# Patient Record
Sex: Male | Born: 1975
Health system: Southern US, Community
[De-identification: ages and names within clinical notes are randomized; demographics above are authoritative.]

## PROBLEM LIST (undated history)

## (undated) DIAGNOSIS — I10 Essential (primary) hypertension: Secondary | ICD-10-CM

## (undated) DIAGNOSIS — G5760 Lesion of plantar nerve, unspecified lower limb: Secondary | ICD-10-CM

## (undated) DIAGNOSIS — D860 Sarcoidosis of lung: Secondary | ICD-10-CM

## (undated) DIAGNOSIS — R002 Palpitations: Secondary | ICD-10-CM

## (undated) DIAGNOSIS — C801 Malignant (primary) neoplasm, unspecified: Secondary | ICD-10-CM

## (undated) DIAGNOSIS — R972 Elevated prostate specific antigen [PSA]: Secondary | ICD-10-CM

## (undated) DIAGNOSIS — G629 Polyneuropathy, unspecified: Secondary | ICD-10-CM

## (undated) DIAGNOSIS — M79673 Pain in unspecified foot: Secondary | ICD-10-CM

## (undated) HISTORY — DX: Elevated prostate specific antigen (PSA): R97.20

## (undated) HISTORY — DX: Pain in unspecified foot: M79.673

## (undated) HISTORY — DX: Polyneuropathy, unspecified: G62.9

## (undated) HISTORY — DX: Palpitations: R00.2

## (undated) HISTORY — DX: Sarcoidosis of lung: D86.0

## (undated) HISTORY — DX: Lesion of plantar nerve, unspecified lower limb: G57.60

## (undated) HISTORY — PX: LUNG BIOPSY: SHX232

---

## 2004-10-20 ENCOUNTER — Encounter: Admission: RE | Admit: 2004-10-20 | Discharge: 2004-10-20 | Payer: Self-pay | Admitting: Internal Medicine

## 2004-10-21 ENCOUNTER — Inpatient Hospital Stay (HOSPITAL_COMMUNITY): Admission: EM | Admit: 2004-10-21 | Discharge: 2004-10-22 | Payer: Self-pay | Admitting: Emergency Medicine

## 2006-05-24 ENCOUNTER — Encounter: Admission: RE | Admit: 2006-05-24 | Discharge: 2006-05-24 | Payer: Self-pay | Admitting: Internal Medicine

## 2006-06-01 ENCOUNTER — Ambulatory Visit: Payer: Self-pay | Admitting: Internal Medicine

## 2006-06-01 LAB — CONVERTED CEMR LAB: Sed Rate: 41 mm/hr — ABNORMAL HIGH (ref 0–20)

## 2006-06-03 ENCOUNTER — Encounter (INDEPENDENT_AMBULATORY_CARE_PROVIDER_SITE_OTHER): Payer: Self-pay | Admitting: Specialist

## 2006-06-03 ENCOUNTER — Ambulatory Visit: Payer: Self-pay | Admitting: Internal Medicine

## 2006-06-03 ENCOUNTER — Ambulatory Visit: Admission: RE | Admit: 2006-06-03 | Discharge: 2006-06-03 | Payer: Self-pay | Admitting: Internal Medicine

## 2006-09-09 ENCOUNTER — Emergency Department (HOSPITAL_COMMUNITY): Admission: EM | Admit: 2006-09-09 | Discharge: 2006-09-09 | Payer: Self-pay | Admitting: Emergency Medicine

## 2006-10-08 ENCOUNTER — Ambulatory Visit: Payer: Self-pay | Admitting: Internal Medicine

## 2007-01-23 ENCOUNTER — Emergency Department (HOSPITAL_COMMUNITY): Admission: EM | Admit: 2007-01-23 | Discharge: 2007-01-23 | Payer: Self-pay | Admitting: Emergency Medicine

## 2010-06-27 NOTE — H&P (Signed)
NAME:  [place], [place] Murphy NO.:  [id number]   MEDICAL RECORD NO.:  [id number]          PATIENT TYPE:  EMS   LOCATION:  MAJO                         FACILITY:  [facility]   PHYSICIAN:  Michaelyn Barter, M.D. DATE OF BIRTH:  12/30/1975   DATE OF ADMISSION:  10/20/2004  DATE OF DISCHARGE:                                HISTORY & PHYSICAL   PRIMARY CARE PHYSICIAN:  Georgianne Fick, M.D.   CHIEF COMPLAINT:  Headache.   HISTORY OF PRESENT ILLNESS:  Mr. Craig [place] is a 35 year old gentleman who  states that Thursday or Friday of last week he developed a headache. His  back started hurting later in the day. He had a fever of approximately 101  on Saturday and soreness in his eyes since Friday. He describes his headache  as pounding. It is made worse by standing up too fast. Almost similar to a  migraine. No nausea or emesis. Positive loss of appetite. No sick contact or  other individuals at home with similar symptoms. Some sensitivity to light.  The patient took Advil and DayQuil but neither stopped his pain.   PAST MEDICAL HISTORY:  No illnesses.   PAST SURGICAL HISTORY:  No illnesses.   ALLERGIES:  None.   MEDICATIONS:  Multivitamins.   SOCIAL HISTORY:  Cigarettes:  Denies. Alcohol:  One to two beers a week.   FAMILY HISTORY:  Mother has a history of diabetes and hypertension. Father  is deceased secondary to MI at the age of 20.   REVIEW OF SYSTEMS:  As per HIP, otherwise all other systems are negative.   PHYSICAL EXAMINATION:  GENERAL:  The patient appears to be sleepy; however,  he is cooperative.  VITAL SIGNS:  Blood pressure 140/80, temperature of 98.8.  HEENT:  Normocephalic and atraumatic. Extraocular movements are intact.  Tympanic membranes are also intact and they are pearly gray in color;  however, they does appear to be a slight amount of erythema around the  tympanic membranes. Neck is supple. No nuchal rigidity. No lymphadenopathy.  Thyroid is not  palpable.  CARDIOVASCULAR:  S1 and S2 is present. Regular rate and rhythm. No S3, S4.  No murmurs, gallops, or rubs.  LUNGS:  Clear. No crackles and no wheezes.  ABDOMEN:  Soft, nontender, and nondistended. Positive bowel sounds. Some  slight right quadrant tenderness to palpation.  EXTREMITIES:  No edema.  NEUROLOGICAL:  The patient is alert and oriented x3. Cranial nerves II  through XII intact. No facial asymmetry. No pronator drift. No focal  deficits.   LABORATORY DATA:  White blood cell count 4.6, hemoglobin 13.8, hematocrit  40.4, platelets 264,000. SGOT 24, SGPT 21. Sodium 135, potassium 3.5,  chloride 102, CO2 27, BUN 8, creatinine 1.3, glucose 103. Total protein 6.7,  albumin 3.8, calcium 8.9, alkaline phosphate 58. CSF from tube #1 white  blood cells were 40, red blood cells 325, segmented neutrophils 12,  lymphocytes 66, monocytes/macrophages 22, CSF glucose 57, total protein 51.  From tube #4, white blood cell count 92, red blood cell count 46, segmented  neutrophils 8, lymphocytes 69, monocytes macrophages 23.   ASSESSMENT/PLAN:  Headache:  The etiology of which per lumbar puncture  results indicates that the patient may have aseptic/viral meningitis as the  cause. Cultures have been sent but are still pending. We will follow up the  cultures. Neurology has also been consulted. Their final recommendations are  still pending. We will provide p.r.n. medications for now and we will  consider starting acyclovir until final lab results are provided.      Michaelyn Barter, M.D.  Electronically Signed     OR/MEDQ  D:  10/21/2004  T:  10/21/2004  Job:  [id number]   cc:   Georgianne Fick, M.D.  [address]  [place]  [place] [address]  Fax: [phone]

## 2010-06-27 NOTE — Assessment & Plan Note (Signed)
[facility]                             PULMONARY OFFICE NOTE   NAME:[place], [place] Murphy                    MRN:          [id number]  DATE:06/01/2006                            DOB:          1975-09-29    REFERRING PHYSICIAN:  Georgianne Fick, M.D.   HISTORY:  This is a very nice, 35 year old, black male who has been very  athletically active despite being a smoker until he abruptly stopped on  April 10, 2006 because it was time. However, shortly after he stopped  smoking, he began having generalized anterior chest discomfort worse  when he lies on his right side associated with a hacking cough  productive of minimum sputum and dyspnea that occurs paroxysmally and  without reproduction with activity. He has undergone a thorough  evaluation by Dr. Nicholos Johns that indicates bilateral hilar adenopathy  and interstitial lung disease and is therefore seen at Dr.  Carolyn Stare request for possible sarcoid.   The patient denies any ocular articular symptoms. He has noticed mild  sweats in the last several weeks but no unintended weight loss or  dysphagia.   PAST MEDICAL HISTORY:  This patient has basically been healthy and his  last x-ray was over 10 years ago.   MEDICATIONS:  None.   SOCIAL HISTORY:  He quit smoking in March 2008. As noted, he works as an  Personnel officer. He likes to work out all the time.   FAMILY HISTORY:  Positive for heart disease in his father. Negative for  rheumatologic disease including sarcoid.   REVIEW OF SYSTEMS:  Taken in detail on the worksheet and negative except  as outlined above.   PHYSICAL EXAMINATION:  GENERAL:  This is an anxious but quite pleasant,  ambulatory, healthy-appearing, black male in no acute distress.  VITAL SIGNS:  Temperature 100.4 in the office. Vital signs normal.  HEENT:  Oropharynx clear. Nasal turbinates reveal minimal edema. Ear  canals are clear bilaterally.  NECK:  Supple without  cervical adenopathy or tenderness. The trachea is  midline, no thyromegaly.  LUNGS:  Lung fields perfectly clear bilaterally to auscultation and  percussion.  HEART:  He has a regular rate and rhythm without murmur, gallop or rub.  ABDOMEN:  Soft and benign.  EXTREMITIES:  Warm without calf tenderness, cyanosis, clubbing or edema.   A thorough workup by Dr. Nicholos Johns was reviewed including a CT scan  of the chest from April 14 indicating mediastinal bilateral hilar  adenopathy with also right lower lobe nodules and air space disease and  an echocardiogram that revealed  trivial tricuspid regurgitation dated  May 24, 2006. CBC was normal and chemistry profile was normal with a  normal protein to albumin ratio and calcium levels.   IMPRESSION:  Classic sarcoid of unclear chronicity. I believe the  connection to stopping smoking is more related to the fact that  cigarettes make people feel like better then they really are rather than  a cause and effect related to stopping smoking. The differential  diagnosis unfortunately includes lymphoma and other granulomatous  processes but I think it is very unlikely based on how  healthy this  patient appears relative to his x-ray findings.   The chest discomfort is a bit unusual for sarcoid but probably is  related to bulky adenopathy which is worse on the right than the left  and note that the infiltrates are worse on the right than the left.   Therefore transbronchial biopsy of the right lower lobe is indicated to  pin down the diagnosis of sarcoid and then a short course of prednisone  (perhaps up to 6 weeks) could be considered.   The earliest that he can schedule this is for May 2 which is fine as  long as his condition does not deteriorate. I spent extra time talking  to him and his wife (nurse at [facility]) about the natural  history of sarcoid including the suspected etiology and my philosophy of  trying to find the  lowest dose of prednisone that suppresses the  symptoms and then tapering down from there.   In this context, an ACE level was pending today to see if we could get  more objective data to help guide therapy once it is initiated.     [place]. Craig Sires, MD, [facility]  Electronically Signed    MBW/MedQ  DD: 06/01/2006  DT: 06/01/2006  Job #: [id number]   cc:   Georgianne Fick, M.D.

## 2010-06-27 NOTE — Op Note (Signed)
NAME:  Craig Murphy, Craig Murphy           ACCOUNT NO.:  [id number]   MEDICAL RECORD NO.:  [id number]          PATIENT TYPE:  AMB   LOCATION:  CARD                         FACILITY:  [facility]   PHYSICIAN:  Casimiro Needle B. Sherene Sires, MD, FCCPDATE OF BIRTH:  01/[birth year]/76   DATE OF PROCEDURE:  06/03/2006  DATE OF DISCHARGE:                               OPERATIVE REPORT   PROCEDURE:  Fiberoptic bronchoscopy with bronchial alveolar lavage of  the right middle lobe and transbronchial biopsy of the right lower lobe.   HISTORY AND INDICATIONS:  Please see dictated consultation note.   The patient agreed to the procedure after a full discussion of risks,  benefits, and alternatives in the office.  He was premedicated with a  total of 100 mg of IV Demerol and 10 mg of Versed for adequate sedation  and cough suppression along with 1% lidocaine by updraft nebulizer.   He was continued to be monitored by surface ECG oximetry during the  procedure and nasal oxygen with only briefly desaturations in the upper  80s during the procedure.   Using a standard flexible fiberoptic bronchoscope, the right naris was  easily cannulated with good visualization of the entire oropharynx and  larynx.  The cords moved normally and there were no apparent upper  airway lesions.   Using additional 1% lidocaine the entire tracheobronchial tree was  explored bilaterally with the following findings.  Trachea; all the  airways opened widely.  There was minimal cobblestoning involving the  right-sided airways more than the left-sided airways.   1. The right middle lobe was lavaged in a standard fashion and samples      were sent for cytology, AFB and fungal stain and culture.  2. The transbronchial biopsies were performed in the wedge position in      the right lower lobe basal segments with adequate tissue obtained.      No obvious endobronchial bleeding was noted and no obvious alveolar      hemorrhage nor pneumothorax by  fluoroscopy.   The patient tolerated the procedure well and a follow-up chest x-ray is  pending.   IMPRESSION:  Sarcoidosis with minimal endobronchial involvement.  Since  this patient has had a low grade fever and worsening progressive dyspnea  he needs to be treated today with prednisone starting at 20 mg per day  and return to the office in 2 weeks for follow-up.  Ace level was also  pending at the time of this dictation.      Charlaine Dalton. Sherene Sires, MD, [facility]  Electronically Signed     MBW/MEDQ  D:  06/03/2006  T:  06/03/2006  Job:  [id number]   cc:   Georgianne Fick, M.D.  Fax: [phone]

## 2010-06-27 NOTE — Consult Note (Signed)
NAMEARLON, Murphy NO.:  [id number]   MEDICAL RECORD NO.:  [id number]          PATIENT TYPE:  EMS   LOCATION:  MAJO                         FACILITY:  [facility]   PHYSICIAN:  Genene Churn. Love, M.D.    DATE OF BIRTH:  03-Mar-1975   DATE OF CONSULTATION:  10/20/2004  DATE OF DISCHARGE:                                   CONSULTATION   This is 35 year old right-handed, black, married male was seen at the  request of Dr. Georgianne Fick and the ER physician for evaluation of a 3-  day history of progressive headaches.   HISTORY OF PRESENT ILLNESS:  [place] has a 3-day history of  progressive headaches, including the vertex and retro-orbital region  associated with upper back and lower back pain and a low-grade fever to 101  degrees for which he has been using Advil.  He reports that his daughter was  sick with a cold about 2 weeks ago.  The patient has had no history of  double vision, loss of vision, swallowing problems, slurred speech, black  out spells or seizures.  He does have a past history of palpitations.  He  denies any current problems with tick exposure or head or neck trauma and  there is no family history of aneurysm.   He was seen by Dr. Nicholos Johns today and had a CT scan of the brain which  was unremarkable.  Other laboratory data included a white blood cell count  of 5400, hemoglobin 14.9, hematocrit 44.3, and platelet count 270,000.  Sodium 137, potassium 4.3, chloride 105, CO2 content 27.  Liver function  tests were normal.   PHYSICAL EXAMINATION:  GENERAL:  Well-developed male in no acute distress.  VITAL SIGNS:  Temperature 98.8 degrees.  Blood pressure in the right arm was  140/80 and left arm 138/80.  Heart rate was 64 and regular.  NECK:  Stiff, he had a positive Kernig's, positive Brudzinski's.  There were  no bruits.  MENTAL STATUS:  He was alert and oriented x3.  NEUROLOGIC:  His cranial nerve examination revealed visual fields were  full,  the disks were flat.  Extraocular movements were full.  Corneals present.  No VII nerve palsy.  Hearing intact.  Air conduction greater than bone  conduction .  Tongue midline, uvula midline, gag present.  Sternocleidomastoid, trapezius testing normal.  Motor examination with 5/5  strength in upper and lower extremities.  Coordination testing with finger-  to-nose, heel-to-shin and rapid alternating movements normal.  Sensory  examination intact to pinprick, touch, position, vibration testing.  Deep  tendon reflexes 2+ and plantar response was downgoing.   IMPRESSION:  Aseptic meningitis by history and examination, 047.9.   PLAN:  At this time, obtain LP under fluoroscopy.           ______________________________  Genene Churn. Sandria Manly, M.D.     JML/MEDQ  D:  10/20/2004  T:  10/21/2004  Job:  [id number]

## 2010-11-24 LAB — CBC
HCT: 40.1
Hemoglobin: 14
MCHC: 34.9
MCV: 86.8

## 2010-11-24 LAB — COMPREHENSIVE METABOLIC PANEL
AST: 34
Alkaline Phosphatase: 62
CO2: 30
Calcium: 9.3
Chloride: 102
Creatinine, Ser: 1.19
GFR calc Af Amer: >60
GFR calc non Af Amer: >60
Sodium: 138
Total Protein: 7.2

## 2010-11-24 LAB — POCT CARDIAC MARKERS: CKMB, poc: 1.2

## 2010-11-24 LAB — DIFFERENTIAL
Basophils Absolute: 0
Eosinophils Absolute: 0.1
Lymphocytes Relative: 32
Lymphs Abs: 1.2
Monocytes Absolute: 0.4
Monocytes Relative: 11
Neutrophils Relative %: 54

## 2016-07-01 ENCOUNTER — Ambulatory Visit (INDEPENDENT_AMBULATORY_CARE_PROVIDER_SITE_OTHER): Payer: BLUE CROSS/BLUE SHIELD | Admitting: Pulmonary Disease

## 2016-07-01 ENCOUNTER — Ambulatory Visit (INDEPENDENT_AMBULATORY_CARE_PROVIDER_SITE_OTHER)
Admission: RE | Admit: 2016-07-01 | Discharge: 2016-07-01 | Disposition: A | Discharge status: Home / Self Care | Payer: BLUE CROSS/BLUE SHIELD | Source: Ambulatory Visit | Attending: Pulmonary Disease | Admitting: Pulmonary Disease

## 2016-07-01 ENCOUNTER — Encounter: Payer: Self-pay | Admitting: Pulmonary Disease

## 2016-07-01 VITALS — BP 122/82 | HR 85 | Ht 71.0 in | Wt 237.8 lb

## 2016-07-01 DIAGNOSIS — D869 Sarcoidosis, unspecified: Secondary | ICD-10-CM

## 2016-07-01 MED ORDER — PREDNISONE 10 MG PO TABS: ORAL_TABLET | ORAL | 0 refills | Status: DC

## 2016-07-01 NOTE — Assessment & Plan Note (Addendum)
Patient was diagnosed with sarcoidosis in 2008. He presented with dyspnea and cough. He had a bronchoscopy and biopsy done by Dr. Melvyn Novas. He improved with couple months of prednisone. Cough recurred recently.  Plan : 1. We extensively discussed the diagnosis of sarcoidosis. 2. Chest x-ray today. If abnormal, may need a chest CT scan. 3. Prednisone taper. We will start with 30 mg a day for a couple weeks. Try to cut down to 20 mg a day subsequently. Hopefully we can wean off over the next 2-3 months. 4. Discussed with him to have yearly eye checks. 5. Discussed with him regarding vaccinations. He wanted to hold off for now. 6. He will need PFT on follow-up.
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
## 2016-07-01 NOTE — Patient Instructions (Signed)
It was a pleasure taking care of you today!  You are diagnosed with Sarcoidosis  We will start you on prednisone, 10 mg per tablet, 3 tablets a day as we discussed. We will get a chest Xray today.   Please call the office if you are having issues with your medications   Return to clinic in 3 months with NP

## 2016-07-07 ENCOUNTER — Telehealth: Payer: Self-pay | Admitting: Pulmonary Disease

## 2016-07-07 MED ORDER — PREDNISONE 10 MG PO TABS: ORAL_TABLET | ORAL | 0 refills | Status: DC

## 2016-07-07 NOTE — Telephone Encounter (Signed)
Patient checking on rx for prednisone - he can be reached at [phone] -pr

## 2016-07-07 NOTE — Telephone Encounter (Signed)
   We were supposed to have sent rx for prednisone.  Anyway, pls send rx for prednisone : 10 mg/tab, 3 tabs/day for 1 month then cut down to 2 tabs a day for 1 month. Pt needs a f/u in 6-8 weeks. Thanks.   Monica Becton, MD 07/07/2016, 5:13 PM [facility] Pager [phone] After 3 pm or if no answer, call [phone]

## 2016-07-07 NOTE — Telephone Encounter (Signed)
lmtcb X1 for pt to make aware of prednisone recs.

## 2016-07-07 NOTE — Telephone Encounter (Signed)
Spoke with pt, requesting refill on prednisone.  Per 5/23 AVS:  Plan : 1. We extensively discussed the diagnosis of sarcoidosis. 2. Chest x-ray today. If abnormal, may need a chest CT scan. 3. Prednisone taper. We will start with 30 mg a day for a couple weeks. Try to cut down to 20 mg a day subsequently. Hopefully we can wean off over the next 2-3 months. 4. Discussed with him to have yearly eye checks. 5. Discussed with him regarding vaccinations. He wanted to hold off for now. 6. He will need PFT on follow-up.   Only 9 tabs of prednisone was called in to pharmacy on this visit.Durene Cal in a rx of prednisone for pt to continue taking 30mg  qd (sent in 90 tabs with 0 refills).  AD please advise on details for long term prednisone weaning so a rx can be sent in.  Thanks.

## 2016-07-08 NOTE — Telephone Encounter (Signed)
Spoke with the pt and notified of prednisone recs  He verbalized understanding  I scheduled f/u with SG for 09/07/16

## 2016-09-07 ENCOUNTER — Ambulatory Visit (INDEPENDENT_AMBULATORY_CARE_PROVIDER_SITE_OTHER): Payer: BLUE CROSS/BLUE SHIELD | Admitting: Acute Care

## 2016-09-07 ENCOUNTER — Encounter: Payer: Self-pay | Admitting: Acute Care

## 2016-09-07 DIAGNOSIS — D869 Sarcoidosis, unspecified: Secondary | ICD-10-CM

## 2016-09-07 NOTE — Assessment & Plan Note (Signed)
Recent flare is resolved. No cough or dyspnea Plan Follow up as needed for sarcoid flare, cough or dyspnea. Make sure you have your eye exam every year. Flu shot in the fall. Please contact office for sooner follow up if symptoms do not improve or worsen or seek emergency care  Patient states he does not want PFT at present time. Patient states he would prefer to follow-up on an as-needed basis.

## 2016-09-07 NOTE — Progress Notes (Signed)
Chart and office note reviewed in detail  > agree with a/p as outlined    
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
## 2016-09-07 NOTE — Patient Instructions (Signed)
I am glad you are doing better.  Follow up as needed for sarcoid flare, cough or dyspnea. Make sure you have your eye exam every year. Flu shot in the fall. Please contact office for sooner follow up if symptoms do not improve or worsen or seek emergency care

## 2016-10-01 ENCOUNTER — Ambulatory Visit: Payer: BLUE CROSS/BLUE SHIELD | Admitting: Acute Care

## 2018-04-29 DIAGNOSIS — Z0000 Encounter for general adult medical examination without abnormal findings: Secondary | ICD-10-CM | POA: Diagnosis not present

## 2018-04-29 DIAGNOSIS — M5412 Radiculopathy, cervical region: Secondary | ICD-10-CM | POA: Diagnosis not present

## 2018-04-29 DIAGNOSIS — R5382 Chronic fatigue, unspecified: Secondary | ICD-10-CM | POA: Diagnosis not present

## 2018-04-29 DIAGNOSIS — R600 Localized edema: Secondary | ICD-10-CM | POA: Diagnosis not present

## 2018-05-13 DIAGNOSIS — Z0000 Encounter for general adult medical examination without abnormal findings: Secondary | ICD-10-CM | POA: Diagnosis not present

## 2018-05-13 DIAGNOSIS — M5412 Radiculopathy, cervical region: Secondary | ICD-10-CM | POA: Diagnosis not present

## 2018-05-13 DIAGNOSIS — R600 Localized edema: Secondary | ICD-10-CM | POA: Diagnosis not present

## 2018-05-13 DIAGNOSIS — D860 Sarcoidosis of lung: Secondary | ICD-10-CM | POA: Diagnosis not present

## 2018-10-31 ENCOUNTER — Other Ambulatory Visit (INDEPENDENT_AMBULATORY_CARE_PROVIDER_SITE_OTHER): Payer: BC Managed Care – PPO

## 2018-10-31 ENCOUNTER — Other Ambulatory Visit: Payer: Self-pay | Admitting: Pulmonary Disease

## 2018-10-31 ENCOUNTER — Ambulatory Visit (INDEPENDENT_AMBULATORY_CARE_PROVIDER_SITE_OTHER)
Admission: RE | Admit: 2018-10-31 | Discharge: 2018-10-31 | Disposition: A | Discharge status: Home / Self Care | Payer: BC Managed Care – PPO | Source: Ambulatory Visit | Attending: Pulmonary Disease | Admitting: Pulmonary Disease

## 2018-10-31 ENCOUNTER — Encounter: Payer: Self-pay | Admitting: Pulmonary Disease

## 2018-10-31 ENCOUNTER — Telehealth: Payer: Self-pay

## 2018-10-31 ENCOUNTER — Ambulatory Visit (INDEPENDENT_AMBULATORY_CARE_PROVIDER_SITE_OTHER): Payer: BC Managed Care – PPO | Admitting: Pulmonary Disease

## 2018-10-31 ENCOUNTER — Other Ambulatory Visit: Payer: Self-pay

## 2018-10-31 DIAGNOSIS — R059 Cough, unspecified: Secondary | ICD-10-CM

## 2018-10-31 DIAGNOSIS — R05 Cough: Secondary | ICD-10-CM | POA: Diagnosis not present

## 2018-10-31 DIAGNOSIS — D869 Sarcoidosis, unspecified: Secondary | ICD-10-CM | POA: Diagnosis not present

## 2018-10-31 DIAGNOSIS — I517 Cardiomegaly: Secondary | ICD-10-CM | POA: Diagnosis not present

## 2018-10-31 LAB — CBC WITH DIFFERENTIAL/PLATELET
Basophils Absolute: 0 10*3/uL (ref 0.0–0.1)
Basophils Relative: 0.8 % (ref 0.0–3.0)
Eosinophils Absolute: 0.1 10*3/uL (ref 0.0–0.7)
Eosinophils Relative: 1.2 % (ref 0.0–5.0)
HCT: 42.4 % (ref 39.0–52.0)
Hemoglobin: 14.2 g/dL (ref 13.0–17.0)
Lymphocytes Relative: 32.2 % (ref 12.0–46.0)
Lymphs Abs: 1.6 10*3/uL (ref 0.7–4.0)
MCHC: 33.6 g/dL (ref 30.0–36.0)
MCV: 92.8 fl (ref 78.0–100.0)
Monocytes Absolute: 0.4 10*3/uL (ref 0.1–1.0)
Monocytes Relative: 8.5 % (ref 3.0–12.0)
Neutro Abs: 2.8 10*3/uL (ref 1.4–7.7)
Neutrophils Relative %: 57.3 % (ref 43.0–77.0)
Platelets: 273 10*3/uL (ref 150.0–400.0)
RBC: 4.57 Mil/uL (ref 4.22–5.81)
RDW: 13.3 % (ref 11.5–15.5)
WBC: 4.9 10*3/uL (ref 4.0–10.5)

## 2018-10-31 LAB — COMPREHENSIVE METABOLIC PANEL
ALT: 37 U/L (ref 0–53)
AST: 30 U/L (ref 0–37)
Albumin: 4.6 g/dL (ref 3.5–5.2)
Alkaline Phosphatase: 59 U/L (ref 39–117)
BUN: 16 mg/dL (ref 6–23)
CO2: 29 mEq/L (ref 19–32)
Calcium: 10 mg/dL (ref 8.4–10.5)
Chloride: 102 mEq/L (ref 96–112)
Creatinine, Ser: 1.17 mg/dL (ref 0.40–1.50)
GFR: 82.06 mL/min (ref >60.00)
Glucose, Bld: 95 mg/dL (ref 70–99)
Potassium: 4.2 mEq/L (ref 3.5–5.1)
Sodium: 139 mEq/L (ref 135–145)
Total Bilirubin: 0.5 mg/dL (ref 0.2–1.2)
Total Protein: 7.8 g/dL (ref 6.0–8.3)

## 2018-10-31 MED ORDER — BENZONATATE 200 MG PO CAPS: 200.0000 mg | ORAL_CAPSULE | Freq: Three times a day (TID) | ORAL | 1 refills | Status: DC | PRN
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
## 2018-10-31 NOTE — Patient Instructions (Signed)
You were seen today by Lauraine Rinne, NP  for:   1. Sarcoidosis  - DG Chest 2 View; Future - Angiotensin converting enzyme; Future - Comp Met (CMET); Future - CBC with Differential/Platelet; Future - Pulmonary function test; Future  We will get baseline lab work on you today We will also get a chest x-ray  Likely based off the symptoms that you are experiencing today we will start you on a course of prednisone.  We need to get you back into our office to establish with Dr. Vaughan Browner as well as to have a full pulmonary function test.  2. Cough  - DG Chest 2 View; Future  Suspect this is likely a sarcoid flare that will require steroids.  Will get chest x-ray to further evaluate prior to starting treatment.  We recommend today:  Orders Placed This Encounter  Procedures  . DG Chest 2 View    Standing Status:   Future    Standing Expiration Date:   12/31/2019    Order Specific Question:   Reason for Exam (SYMPTOM  OR DIAGNOSIS REQUIRED)    Answer:   hx sarcoid    Order Specific Question:   Preferred imaging location?    Answer:   [facility]    Order Specific Question:   Radiology Contrast Protocol - do NOT remove file path    Answer:   [email or web address]  . Angiotensin converting enzyme    Standing Status:   Future    Standing Expiration Date:   10/31/2019  . Comp Met (CMET)    Standing Status:   Future    Standing Expiration Date:   10/31/2019  . CBC with Differential/Platelet    Standing Status:   Future    Standing Expiration Date:   10/31/2019  . Pulmonary function test    Standing Status:   Future    Standing Expiration Date:   10/31/2019    Order Specific Question:   Where should this test be performed?    Answer:   [facility]    Order Specific Question:   Full PFT: includes the following: basic spirometry, spirometry pre & post bronchodilator, diffusion capacity (DLCO), lung volumes    Answer:   Full PFT   Orders Placed  This Encounter  Procedures  . DG Chest 2 View  . Angiotensin converting enzyme  . Comp Met (CMET)  . CBC with Differential/Platelet  . Pulmonary function test   No orders of the defined types were placed in this encounter.   Follow Up:    No follow-ups on file.   Please do your part to reduce the spread of COVID-19:      Reduce your risk of any infection  and COVID19 by using the similar precautions used for avoiding the common cold or flu:  Marland Kitchen Wash your hands often with soap and warm water for at least 20 seconds.  If soap and water are not readily available, use an alcohol-based hand sanitizer with at least 60% alcohol.  . If coughing or sneezing, cover your mouth and nose by coughing or sneezing into the elbow areas of your shirt or coat, into a tissue or into your sleeve (not your hands). Langley Gauss A MASK when in public  . Avoid shaking hands with others and consider head nods or verbal greetings only. . Avoid touching your eyes, nose, or mouth with unwashed hands.  . Avoid close contact with people who are sick. . Avoid places or events with large numbers  of people in one location, like concerts or sporting events. . If you have some symptoms but not all symptoms, continue to monitor at home and seek medical attention if your symptoms worsen. . If you are having a medical emergency, call 911.   [place] / e-Visit: [email or web address]         [facility]: [facility]: [phone]                   [facility]: [phone]     It is flu season:   >>> Best ways to protect herself from the flu: Receive the yearly flu vaccine, practice good hand hygiene washing with soap and also using hand sanitizer when available, eat a nutritious meals, get adequate rest, hydrate appropriately   Please contact the office if your  symptoms worsen or you have concerns that you are not improving.   Thank you for choosing [facility] for your healthcare, and for allowing [place] to partner with you on your healthcare journey. I am thankful to be able to provide care to you today.   Wyn Quaker FNP-C

## 2018-10-31 NOTE — Assessment & Plan Note (Addendum)
Plan: Chest x-ray today Lab work today Likely will need empiric treatment of steroids Patient needs to establish with Dr. Vaughan Browner over the next 6 to 8 weeks with a full pulmonary function test May need to consider repeat high-resolution CT chest to further evaluate symptoms if they do not improve or if patient has significantly abnormal chest x-ray today Patient needs baseline EKG at next office visit After next office visit establishment Dr. Vaughan Browner can decide whether or not patient should have urine calcium and vitamin D levels drawn

## 2018-10-31 NOTE — Telephone Encounter (Signed)
Pt returning call for results and can be reached @ [phone].Craig Murphy

## 2018-10-31 NOTE — Telephone Encounter (Signed)
ATC pt, no answer. Left message for pt to call back.    Result Notes for DG Chest 2 View  Notes recorded by Lauraine Rinne, NP on 10/31/2018 at 11:38 AM EDT  Attempted to contact the patient regarding chest x-ray results. Not overly changed from 2018. I do think the patient would benefit from having a high-resolution CT of his chest.   Please place order for high-resolution CT chest and can associate to sarcoidosis. Patient should have this completed prior to seeing Dr. Vaughan Browner and prior to doing breathing test.   For right now can call in [facility] and patient can use over-the-counter cough medicine such as Delsym. Would ideally like to get CT imaging prior to starting steroids.   Wyn Quaker FNP

## 2018-10-31 NOTE — Progress Notes (Signed)
Called the patient and advised of the information below. The patient stated that he would like to get it scheduled as soon as possible because his job is not wanting him to come back to work because of his cough. Patient voiced understanding regarding the chest ct and xray. Nothing further needed at this time.

## 2018-10-31 NOTE — Addendum Note (Signed)
Addended by: Lauraine Rinne on: 10/31/2018 11:48 AM   Modules accepted: Orders

## 2018-10-31 NOTE — Telephone Encounter (Signed)
Spoke with the pt  He states he had already been given results and the high res ct was scheduled  I scheduled his rov with PFT with Aaron Edelman for 12/08/18

## 2018-10-31 NOTE — Progress Notes (Signed)
Attempted to contact the patient regarding chest x-ray results.  Not overly changed from 2018.  I do think the patient would benefit from having a high-resolution CT of his chest.  Please place order for high-resolution CT chest and can associate to sarcoidosis.  Patient should have this completed prior to seeing Dr. Vaughan Browner and prior to doing breathing test.  For right now can call in [facility] and patient can use over-the-counter cough medicine such as Delsym.  Would ideally like to get CT imaging prior to starting steroids.  Wyn Quaker FNP

## 2018-11-02 ENCOUNTER — Other Ambulatory Visit: Payer: Self-pay | Admitting: *Deleted

## 2018-11-02 LAB — ANGIOTENSIN CONVERTING ENZYME: Angiotensin-Converting Enzyme: 63 U/L (ref 9–67)

## 2018-11-02 MED ORDER — PREDNISONE 10 MG PO TABS: ORAL_TABLET | ORAL | 0 refills | Status: DC

## 2018-11-02 NOTE — Progress Notes (Signed)
Ace level normal.  This is a marker for sarcoidosis.  Other blood work is also stable.  Can offer:  Prednisone 10mg  tablet  >>>4 tabs for 7 days, then 3 tabs for 7 days, 2 tabs for 7 days, then 1 tab for 7 days, then stop >>>take with food  >>>take in the morning   Please place order  Please proceed forward with CT chest high-res as planned this week.  Wyn Quaker, FNP

## 2018-11-03 ENCOUNTER — Ambulatory Visit (INDEPENDENT_AMBULATORY_CARE_PROVIDER_SITE_OTHER)
Admission: RE | Admit: 2018-11-03 | Discharge: 2018-11-03 | Disposition: A | Discharge status: Home / Self Care | Payer: BC Managed Care – PPO | Source: Ambulatory Visit | Attending: Pulmonary Disease | Admitting: Pulmonary Disease

## 2018-11-03 ENCOUNTER — Other Ambulatory Visit: Payer: Self-pay

## 2018-11-03 DIAGNOSIS — G5763 Lesion of plantar nerve, bilateral lower limbs: Secondary | ICD-10-CM | POA: Diagnosis not present

## 2018-11-03 DIAGNOSIS — D869 Sarcoidosis, unspecified: Secondary | ICD-10-CM

## 2018-11-03 DIAGNOSIS — D860 Sarcoidosis of lung: Secondary | ICD-10-CM | POA: Diagnosis not present

## 2018-11-03 DIAGNOSIS — M7752 Other enthesopathy of left foot: Secondary | ICD-10-CM | POA: Diagnosis not present

## 2018-11-03 DIAGNOSIS — M7751 Other enthesopathy of right foot: Secondary | ICD-10-CM | POA: Diagnosis not present

## 2018-11-03 NOTE — Progress Notes (Signed)
High-resolution CT chest results have come back.  No active pulmonary disease.  No specific parenchymal findings of sarcoidosis.  This is good news.  Continue with prednisone taper as prescribed.  Continue forward with pulmonary function test to further evaluate breathing.  No further recommendations or changes.  Wyn Quaker, FNP

## 2018-11-11 DIAGNOSIS — G5763 Lesion of plantar nerve, bilateral lower limbs: Secondary | ICD-10-CM | POA: Diagnosis not present

## 2018-11-11 DIAGNOSIS — M7752 Other enthesopathy of left foot: Secondary | ICD-10-CM | POA: Diagnosis not present

## 2018-11-11 DIAGNOSIS — G5791 Unspecified mononeuropathy of right lower limb: Secondary | ICD-10-CM | POA: Diagnosis not present

## 2018-11-11 DIAGNOSIS — M7751 Other enthesopathy of right foot: Secondary | ICD-10-CM | POA: Diagnosis not present

## 2018-11-18 ENCOUNTER — Other Ambulatory Visit: Payer: BC Managed Care – PPO

## 2018-11-21 DIAGNOSIS — M7751 Other enthesopathy of right foot: Secondary | ICD-10-CM | POA: Diagnosis not present

## 2018-11-21 DIAGNOSIS — G5791 Unspecified mononeuropathy of right lower limb: Secondary | ICD-10-CM | POA: Diagnosis not present

## 2018-11-21 DIAGNOSIS — M7752 Other enthesopathy of left foot: Secondary | ICD-10-CM | POA: Diagnosis not present

## 2018-11-21 DIAGNOSIS — G5763 Lesion of plantar nerve, bilateral lower limbs: Secondary | ICD-10-CM | POA: Diagnosis not present

## 2018-12-06 ENCOUNTER — Telehealth: Payer: Self-pay | Admitting: Pulmonary Disease

## 2018-12-06 NOTE — Telephone Encounter (Signed)
-----   Message from Satira Anis sent at 12/06/2018  3:36 PM EDT ----- Regarding: PFT/COVID Hey.  I had to cancel this patient's PFT on 10/29 as he never responded to messages left for him to schedule a COVID test.  He has an appt that day at 3.  Should we reschedule this appt until we can get COVID & PFT scheduled  or keep it since we haven't had any luck reaching the patient?   Thanks a bunch, [employer]

## 2018-12-06 NOTE — Telephone Encounter (Signed)
12/06/2018 1540  Yes please keep appointment as it is.  Patient has had issues with follow-up with our office as well as completing pulmonary function testing in the past.  Have we exhausted all methods of trying to contact the patient?  Have we utilized all the numbers in the chart.  Have we utilized his family's contact information listed in the chart?  I would make sure that we have documented this.  This can be further evaluated and discussed when I see the patient in office if he decides to come in.  Wyn Quaker, FNP

## 2018-12-08 ENCOUNTER — Ambulatory Visit: Payer: BC Managed Care – PPO | Admitting: Pulmonary Disease

## 2018-12-08 NOTE — Telephone Encounter (Signed)
Unfortunately patient no showed office visit today.  Can we go ahead and mail a letter stating that we have attempted multiple times to contact the patient and he needs to schedule a 30-minute/consult appointment with Dr. Vaughan Browner for sarcoidosis management.  Wyn Quaker, FNP

## 2018-12-09 NOTE — Telephone Encounter (Signed)
Letter will be mailed to patient.

## 2019-01-09 DIAGNOSIS — G5763 Lesion of plantar nerve, bilateral lower limbs: Secondary | ICD-10-CM | POA: Diagnosis not present

## 2019-01-17 DIAGNOSIS — M5417 Radiculopathy, lumbosacral region: Secondary | ICD-10-CM | POA: Diagnosis not present

## 2019-01-17 DIAGNOSIS — M79672 Pain in left foot: Secondary | ICD-10-CM | POA: Diagnosis not present

## 2019-01-17 DIAGNOSIS — R202 Paresthesia of skin: Secondary | ICD-10-CM | POA: Diagnosis not present

## 2019-01-17 DIAGNOSIS — D529 Folate deficiency anemia, unspecified: Secondary | ICD-10-CM | POA: Diagnosis not present

## 2019-01-17 DIAGNOSIS — R7301 Impaired fasting glucose: Secondary | ICD-10-CM | POA: Diagnosis not present

## 2019-01-17 DIAGNOSIS — M79671 Pain in right foot: Secondary | ICD-10-CM | POA: Diagnosis not present

## 2019-01-17 DIAGNOSIS — E559 Vitamin D deficiency, unspecified: Secondary | ICD-10-CM | POA: Diagnosis not present

## 2019-01-17 DIAGNOSIS — E538 Deficiency of other specified B group vitamins: Secondary | ICD-10-CM | POA: Diagnosis not present

## 2019-01-17 DIAGNOSIS — G5602 Carpal tunnel syndrome, left upper limb: Secondary | ICD-10-CM | POA: Diagnosis not present

## 2019-01-17 DIAGNOSIS — G603 Idiopathic progressive neuropathy: Secondary | ICD-10-CM | POA: Diagnosis not present

## 2019-01-24 DIAGNOSIS — G5602 Carpal tunnel syndrome, left upper limb: Secondary | ICD-10-CM | POA: Diagnosis not present

## 2019-01-24 DIAGNOSIS — R202 Paresthesia of skin: Secondary | ICD-10-CM | POA: Diagnosis not present

## 2019-01-24 DIAGNOSIS — G603 Idiopathic progressive neuropathy: Secondary | ICD-10-CM | POA: Diagnosis not present

## 2019-01-24 DIAGNOSIS — M5417 Radiculopathy, lumbosacral region: Secondary | ICD-10-CM | POA: Diagnosis not present

## 2019-02-14 DIAGNOSIS — U071 COVID-19: Secondary | ICD-10-CM | POA: Diagnosis not present

## 2019-03-16 DIAGNOSIS — G603 Idiopathic progressive neuropathy: Secondary | ICD-10-CM | POA: Diagnosis not present

## 2019-03-16 DIAGNOSIS — M5417 Radiculopathy, lumbosacral region: Secondary | ICD-10-CM | POA: Diagnosis not present

## 2019-03-16 DIAGNOSIS — R202 Paresthesia of skin: Secondary | ICD-10-CM | POA: Diagnosis not present

## 2019-03-16 DIAGNOSIS — G5602 Carpal tunnel syndrome, left upper limb: Secondary | ICD-10-CM | POA: Diagnosis not present

## 2019-04-20 DIAGNOSIS — M5417 Radiculopathy, lumbosacral region: Secondary | ICD-10-CM | POA: Diagnosis not present

## 2019-04-20 DIAGNOSIS — R202 Paresthesia of skin: Secondary | ICD-10-CM | POA: Diagnosis not present

## 2019-04-20 DIAGNOSIS — G603 Idiopathic progressive neuropathy: Secondary | ICD-10-CM | POA: Diagnosis not present

## 2019-06-02 DIAGNOSIS — Z13220 Encounter for screening for lipoid disorders: Secondary | ICD-10-CM | POA: Diagnosis not present

## 2019-06-02 DIAGNOSIS — Z0000 Encounter for general adult medical examination without abnormal findings: Secondary | ICD-10-CM | POA: Diagnosis not present

## 2019-06-02 DIAGNOSIS — Z125 Encounter for screening for malignant neoplasm of prostate: Secondary | ICD-10-CM | POA: Diagnosis not present

## 2019-06-02 DIAGNOSIS — Z131 Encounter for screening for diabetes mellitus: Secondary | ICD-10-CM | POA: Diagnosis not present

## 2019-06-09 DIAGNOSIS — Z0000 Encounter for general adult medical examination without abnormal findings: Secondary | ICD-10-CM | POA: Diagnosis not present

## 2019-06-09 DIAGNOSIS — F1010 Alcohol abuse, uncomplicated: Secondary | ICD-10-CM | POA: Diagnosis not present

## 2019-06-09 DIAGNOSIS — D860 Sarcoidosis of lung: Secondary | ICD-10-CM | POA: Diagnosis not present

## 2019-06-09 DIAGNOSIS — G609 Hereditary and idiopathic neuropathy, unspecified: Secondary | ICD-10-CM | POA: Diagnosis not present

## 2019-06-09 DIAGNOSIS — R002 Palpitations: Secondary | ICD-10-CM | POA: Diagnosis not present

## 2019-06-09 DIAGNOSIS — R972 Elevated prostate specific antigen [PSA]: Secondary | ICD-10-CM | POA: Diagnosis not present

## 2019-07-19 ENCOUNTER — Encounter: Payer: Self-pay | Admitting: *Deleted

## 2019-07-21 ENCOUNTER — Encounter: Payer: Self-pay | Admitting: Diagnostic Neuroimaging

## 2019-07-21 ENCOUNTER — Other Ambulatory Visit: Payer: Self-pay

## 2019-07-21 ENCOUNTER — Ambulatory Visit: Payer: BC Managed Care – PPO | Admitting: Diagnostic Neuroimaging

## 2019-07-21 VITALS — Ht 71.0 in | Wt 232.0 lb

## 2019-07-21 DIAGNOSIS — G629 Polyneuropathy, unspecified: Secondary | ICD-10-CM | POA: Diagnosis not present

## 2019-07-21 NOTE — Patient Instructions (Signed)
NEUROPATHY (possible alcoholic neuropathy) - continue vitamin support (b-complex, alpha-lipoic acid) - gradually reduce alcohol use

## 2019-07-21 NOTE — Progress Notes (Signed)
[facility]  PATIENT: Craig Murphy DOB: July 25, 1975  REFERRING CLINICIAN: Merrilee Seashore, MD HISTORY FROM: patient  REASON FOR VISIT: new consult    HISTORICAL  CHIEF COMPLAINT:  Chief Complaint  Patient presents with  . Peripheral Neuropathy    rm 7 New Pt " tingling in feet x 2 years, took anti inflammatory, spinal injection without relief"    HISTORY OF PRESENT ILLNESS:   44 year old male here for evaluation of numbness and tingling in feet.  History of pulmonary sarcoidosis in remission.  For past year patient has had onset of numbness, tingling, burning sensation in the toes and feet.  This is usually worse in the middle or end of the day after he has been at work and active.  Symptoms are less in the morning.  His left foot is slightly more affected than right.  Patient has not seen another neurologist, had EMG testing, lab testing and was diagnosed with neuropathy.  He was treated with some shots in his back and anti-inflammatory medications.  Patient requested second opinion evaluation.  In addition patient has chronic alcohol abuse, averaging 8 beers per day during the week and more on the weekends.  He has started to cut down in the past 1 month, now averaging 3 beers per day.  He does have some tremor and shakiness in the morning when he wakes up.  He was told that chronic alcohol abuse can be associated with neuropathy.    REVIEW OF SYSTEMS: Full 14 system review of systems performed and negative with exception of: As per HPI.  ALLERGIES: No Known Allergies  HOME MEDICATIONS: Outpatient Medications Prior to Visit  Medication Sig Dispense Refill  . Multiple Vitamin (MULTIVITAMIN) tablet Take 1 tablet by mouth daily.    . benzonatate (TESSALON) 200 MG capsule Take 1 capsule (200 mg total) by mouth 3 (three) times daily as needed for cough. 30 capsule 1  . predniSONE (DELTASONE) 10 MG tablet Take by mouth 4 tabs for 7 days, then 3 tabs  for 7 days, 2 tabs for 7 days, then 1 tab for 7 days, then stop 70 tablet 0   No facility-administered medications prior to visit.    PAST MEDICAL HISTORY: Past Medical History:  Diagnosis Date  . Elevated PSA   . Foot pain   . Palpitations   . Peripheral neuropathy   . Plantar neuroma    of feet  . Sarcoidosis of lung ([place])     PAST SURGICAL HISTORY: History reviewed. No pertinent surgical history.  FAMILY HISTORY: Family History  Problem Relation Age of Onset  . Diabetes Mother   . Heart attack Father        age 61    SOCIAL HISTORY: Social History   Socioeconomic History  . Marital status: Married    Spouse name: Terrence Dupont  . Number of children: 2  . Years of education: Not on file  . Highest education level: High school graduate  Occupational History    Comment: [employer]  Tobacco Use  . Smoking status: Never Smoker  . Smokeless tobacco: Never Used  Substance and Sexual Activity  . Alcohol use: Yes    Comment: 07/19/19  twice week  . Drug use: Never  . Sexual activity: Not on file  Other Topics Concern  . Not on file  Social History Narrative   Lives with wife   Caffeine- once a month   Social Determinants of Health   Financial Resource Strain:   .  Difficulty of Paying Living Expenses:   Food Insecurity:   . Worried About Charity fundraiser in the Last Year:   . Arboriculturist in the Last Year:   Transportation Needs:   . Film/video editor (Medical):   Marland Kitchen Lack of Transportation (Non-Medical):   Physical Activity:   . Days of Exercise per Week:   . Minutes of Exercise per Session:   Stress:   . Feeling of Stress :   Social Connections:   . Frequency of Communication with Friends and Family:   . Frequency of Social Gatherings with Friends and Family:   . Attends Religious Services:   . Active Member of Clubs or Organizations:   . Attends Archivist Meetings:   Marland Kitchen Marital Status:   Intimate Partner Violence:   . Fear of Current  or Ex-Partner:   . Emotionally Abused:   Marland Kitchen Physically Abused:   . Sexually Abused:      PHYSICAL EXAM  GENERAL EXAM/CONSTITUTIONAL: Vitals:  Vitals:   07/21/19 0844  Weight: 232 lb (105.2 kg)  Height: '5\' 11"'  (1.803 m)     Body mass index is 32.36 kg/m. Wt Readings from Last 3 Encounters:  07/21/19 232 lb (105.2 kg)  09/07/16 238 lb (108 kg)  07/01/16 237 lb 12.8 oz (107.9 kg)     Patient is in no distress; well developed, nourished and groomed; neck is supple  CARDIOVASCULAR:  Examination of carotid arteries is normal; no carotid bruits  Regular rate and rhythm, no murmurs  Examination of peripheral vascular system by observation and palpation is normal  EYES:  Ophthalmoscopic exam of optic discs and posterior segments is normal; no papilledema or hemorrhages  No exam data present  MUSCULOSKELETAL:  Gait, strength, tone, movements noted in Neurologic exam below  NEUROLOGIC: MENTAL STATUS:  No flowsheet data found.  awake, alert, oriented to person, place and time  recent and remote memory intact  normal attention and concentration  language fluent, comprehension intact, naming intact  fund of knowledge appropriate  CRANIAL NERVE:   2nd - no papilledema on fundoscopic exam  2nd, 3rd, 4th, 6th - pupils equal and reactive to light, visual fields full to confrontation, extraocular muscles intact, no nystagmus  5th - facial sensation symmetric  7th - facial strength symmetric  8th - hearing intact  9th - palate elevates symmetrically, uvula midline  11th - shoulder shrug symmetric  12th - tongue protrusion midline  MOTOR:   normal bulk and tone, full strength in the BUE, BLE  SENSORY:   normal and symmetric to light touch, pinprick, temperature, vibration; SLIGHTLY REDUCED IN TOES  COORDINATION:   finger-nose-finger, fine finger movements normal  REFLEXES:   deep tendon reflexes TRACE and symmetric  GAIT/STATION:   narrow  based gait     DIAGNOSTIC DATA (LABS, IMAGING, TESTING) - I reviewed patient records, labs, notes, testing and imaging myself where available.  Lab Results  Component Value Date   WBC 4.9 10/31/2018   HGB 14.2 10/31/2018   HCT 42.4 10/31/2018   MCV 92.8 10/31/2018   PLT 273.0 10/31/2018      Component Value Date/Time   NA 139 10/31/2018 1125   K 4.2 10/31/2018 1125   CL 102 10/31/2018 1125   CO2 29 10/31/2018 1125   GLUCOSE 95 10/31/2018 1125   BUN 16 10/31/2018 1125   CREATININE 1.17 10/31/2018 1125   CALCIUM 10.0 10/31/2018 1125   PROT 7.8 10/31/2018 1125   ALBUMIN 4.6  10/31/2018 1125   AST 30 10/31/2018 1125   ALT 37 10/31/2018 1125   ALKPHOS 59 10/31/2018 1125   BILITOT 0.5 10/31/2018 1125   GFRNONAA >60 09/09/2006 0233   GFRAA  09/09/2006 0233    >60        The eGFR has been calculated using the MDRD equation. This calculation has not been validated in all clinical   No results found for: CHOL, HDL, LDLCALC, LDLDIRECT, TRIG, CHOLHDL No results found for: HGBA1C No results found for: VITAMINB12 No results found for: TSH     ASSESSMENT AND PLAN  44 y.o. year old male here with numbness, pain, tingling sensation in his toes and feet, with signs and symptoms consistent with peripheral neuropathy.  He has had previous work-up with EMG testing and labs by another neurologist (apparently negative).  Suspect that his neuropathy is related to alcohol abuse.  Dx:  1. Neuropathy      PLAN:  NEUROPATHY (possible alcoholic neuropathy) - continue vitamin support (b-complex, alpha-lipoic acid) - gradually reduce alcohol use  Return for pending if symptoms worsen or fail to improve.    Penni Bombard, MD 07/10/5613, 3:79 AM Certified in Neurology, Neurophysiology and Neuroimaging  [facility] [address], [place], [place] [address] [phone]

## 2019-08-11 DIAGNOSIS — F1010 Alcohol abuse, uncomplicated: Secondary | ICD-10-CM | POA: Diagnosis not present

## 2019-08-11 DIAGNOSIS — R002 Palpitations: Secondary | ICD-10-CM | POA: Diagnosis not present

## 2019-08-11 DIAGNOSIS — R972 Elevated prostate specific antigen [PSA]: Secondary | ICD-10-CM | POA: Diagnosis not present

## 2019-09-15 DIAGNOSIS — H1045 Other chronic allergic conjunctivitis: Secondary | ICD-10-CM | POA: Diagnosis not present

## 2019-09-15 DIAGNOSIS — H0288A Meibomian gland dysfunction right eye, upper and lower eyelids: Secondary | ICD-10-CM | POA: Diagnosis not present

## 2019-09-15 DIAGNOSIS — D869 Sarcoidosis, unspecified: Secondary | ICD-10-CM | POA: Diagnosis not present

## 2019-09-15 DIAGNOSIS — H0288B Meibomian gland dysfunction left eye, upper and lower eyelids: Secondary | ICD-10-CM | POA: Diagnosis not present

## 2019-09-25 DIAGNOSIS — N410 Acute prostatitis: Secondary | ICD-10-CM | POA: Diagnosis not present

## 2019-09-25 DIAGNOSIS — R972 Elevated prostate specific antigen [PSA]: Secondary | ICD-10-CM | POA: Diagnosis not present

## 2019-09-25 DIAGNOSIS — R3914 Feeling of incomplete bladder emptying: Secondary | ICD-10-CM | POA: Diagnosis not present

## 2020-01-02 DIAGNOSIS — N410 Acute prostatitis: Secondary | ICD-10-CM | POA: Diagnosis not present

## 2020-01-02 DIAGNOSIS — R972 Elevated prostate specific antigen [PSA]: Secondary | ICD-10-CM | POA: Diagnosis not present

## 2020-01-29 DIAGNOSIS — Z20822 Contact with and (suspected) exposure to covid-19: Secondary | ICD-10-CM | POA: Diagnosis not present

## 2020-02-12 DIAGNOSIS — M6289 Other specified disorders of muscle: Secondary | ICD-10-CM | POA: Diagnosis not present

## 2020-02-12 DIAGNOSIS — M6281 Muscle weakness (generalized): Secondary | ICD-10-CM | POA: Diagnosis not present

## 2020-02-12 DIAGNOSIS — N410 Acute prostatitis: Secondary | ICD-10-CM | POA: Diagnosis not present

## 2020-02-12 DIAGNOSIS — M62838 Other muscle spasm: Secondary | ICD-10-CM | POA: Diagnosis not present

## 2020-02-19 DIAGNOSIS — M6281 Muscle weakness (generalized): Secondary | ICD-10-CM | POA: Diagnosis not present

## 2020-02-19 DIAGNOSIS — R972 Elevated prostate specific antigen [PSA]: Secondary | ICD-10-CM | POA: Diagnosis not present

## 2020-02-19 DIAGNOSIS — N410 Acute prostatitis: Secondary | ICD-10-CM | POA: Diagnosis not present

## 2020-02-19 DIAGNOSIS — M62838 Other muscle spasm: Secondary | ICD-10-CM | POA: Diagnosis not present

## 2020-07-05 DIAGNOSIS — Z0000 Encounter for general adult medical examination without abnormal findings: Secondary | ICD-10-CM | POA: Diagnosis not present

## 2020-07-05 DIAGNOSIS — Z125 Encounter for screening for malignant neoplasm of prostate: Secondary | ICD-10-CM | POA: Diagnosis not present

## 2020-07-22 DIAGNOSIS — D860 Sarcoidosis of lung: Secondary | ICD-10-CM | POA: Diagnosis not present

## 2020-07-22 DIAGNOSIS — G609 Hereditary and idiopathic neuropathy, unspecified: Secondary | ICD-10-CM | POA: Diagnosis not present

## 2020-07-22 DIAGNOSIS — F1010 Alcohol abuse, uncomplicated: Secondary | ICD-10-CM | POA: Diagnosis not present

## 2020-07-22 DIAGNOSIS — Z0000 Encounter for general adult medical examination without abnormal findings: Secondary | ICD-10-CM | POA: Diagnosis not present

## 2021-01-09 IMAGING — CT CT CHEST HIGH RESOLUTION W/O CM
2 of 6 series · 15 of 36 positions shown, 18 images · non-contrast
Comparison: 05/24/2006 chest CT.

CLINICAL DATA: Sarcoidosis.  Current cough.

EXAM:
CT CHEST WITHOUT CONTRAST
TECHNIQUE: Multidetector CT imaging of the chest was performed following the
standard protocol without intravenous contrast. High resolution
imaging of the lungs, as well as inspiratory and expiratory imaging,
was performed.

[Series 2: high resolution · axial · 0.72mm/px · z∈[-351,-59]mm · 12 of 164 slices shown, 15 images]
[im 9/164  mediastinal]
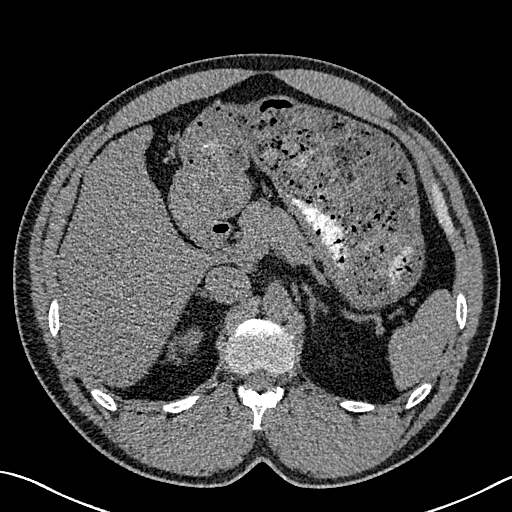
[im 9/164  lung]
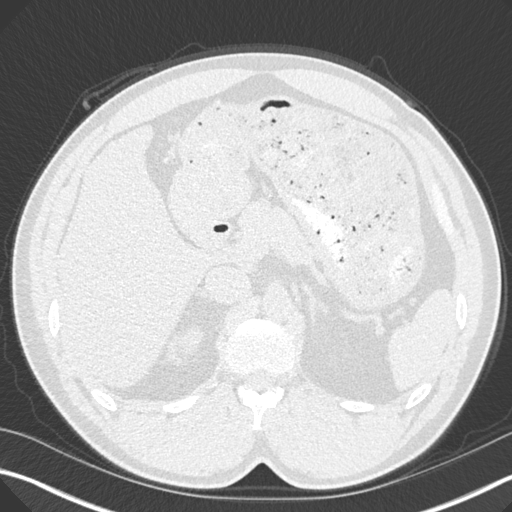
[im 25/164  lung]
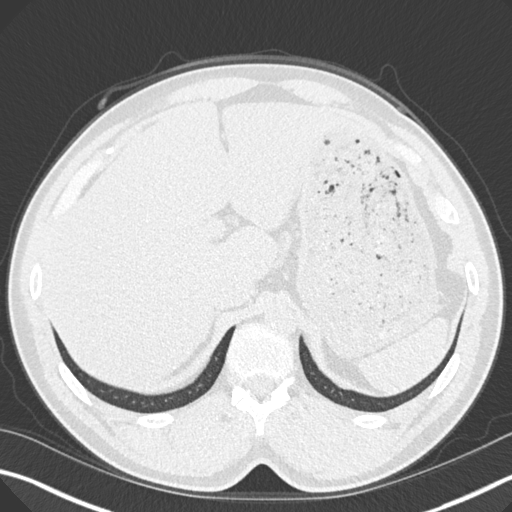
[im 33/164  lung]
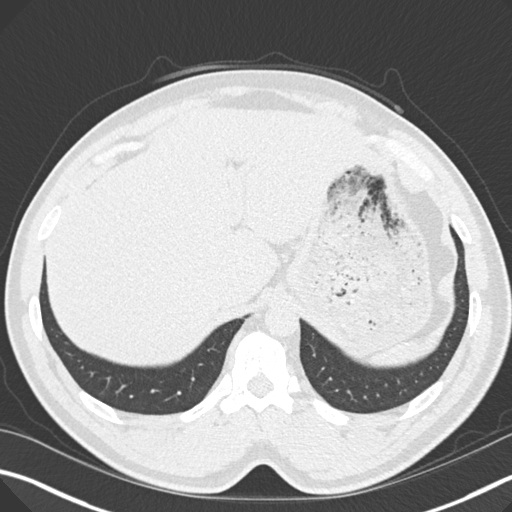
[im 49/164  lung]
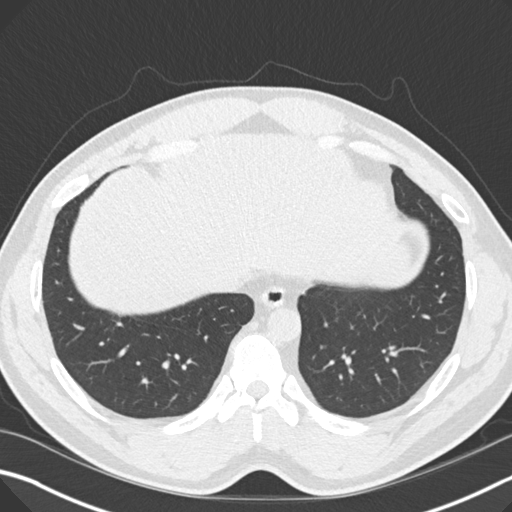
[im 66/164  mediastinal]
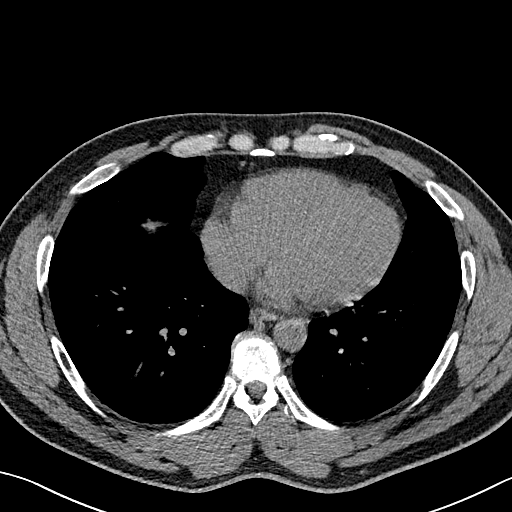
[im 66/164  lung]
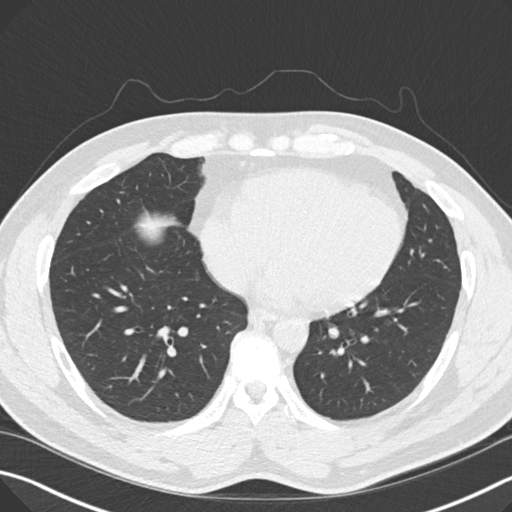
[im 74/164  lung]
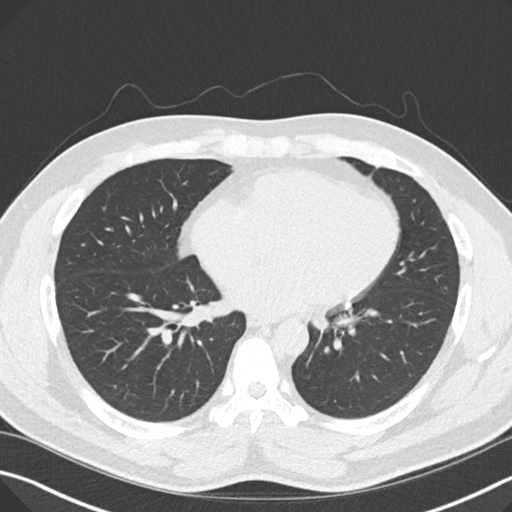
[im 90/164  lung]
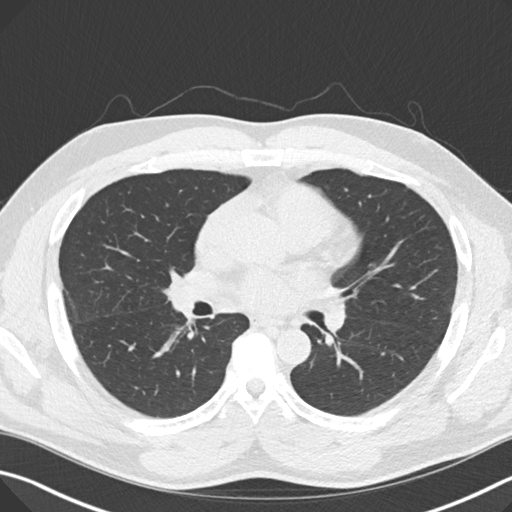
[im 98/164  lung]
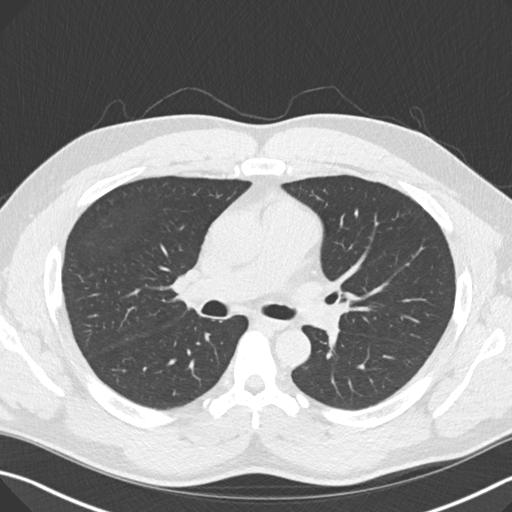
[im 115/164  mediastinal]
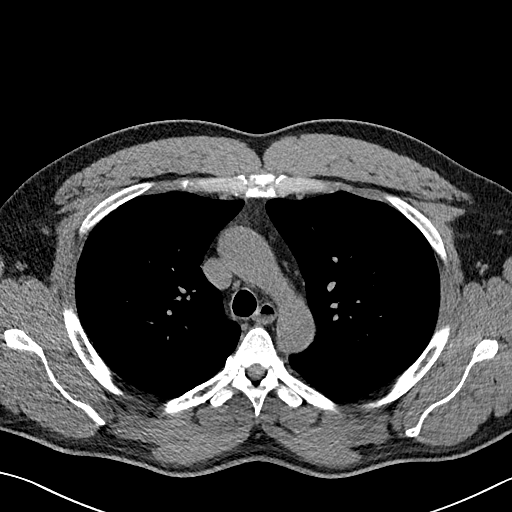
[im 115/164  lung]
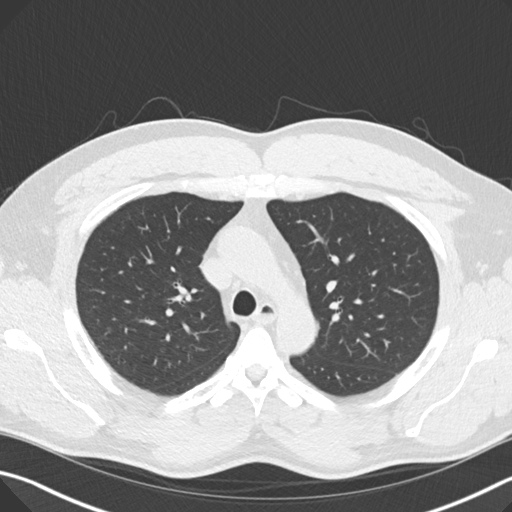
[im 131/164  lung]
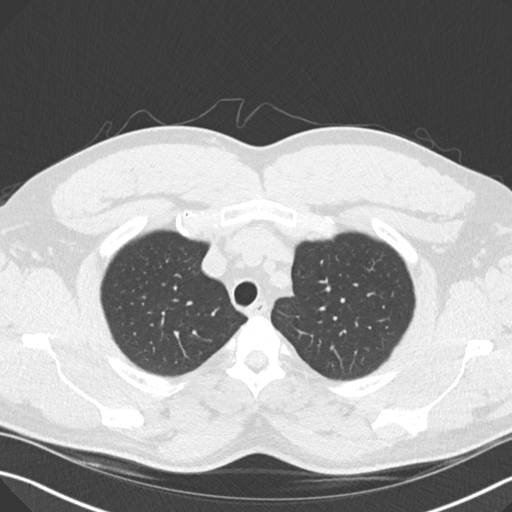
[im 139/164  lung]
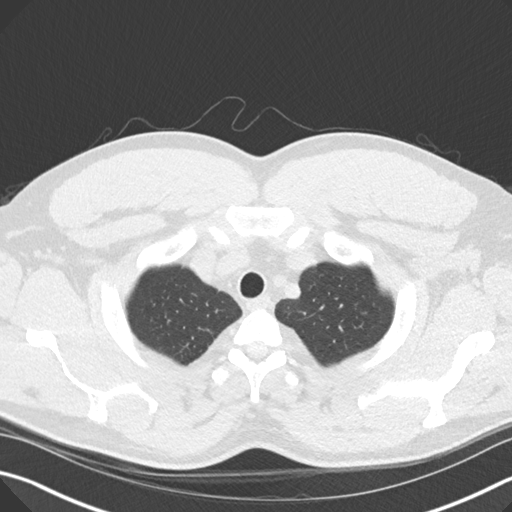
[im 155/164  lung]
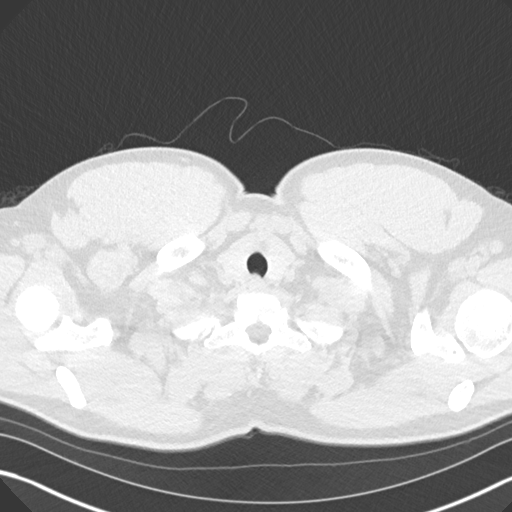

[Series 7: coronal · coronal · 0.65mm/px · 3 of 126 slices shown]
[im 26/126  lung]
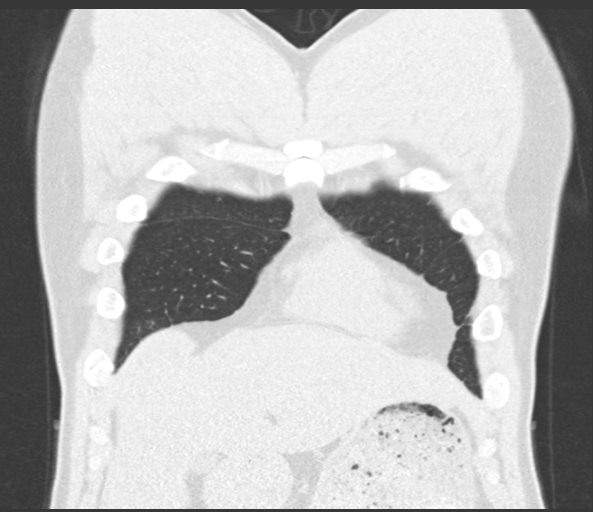
[im 51/126  lung]
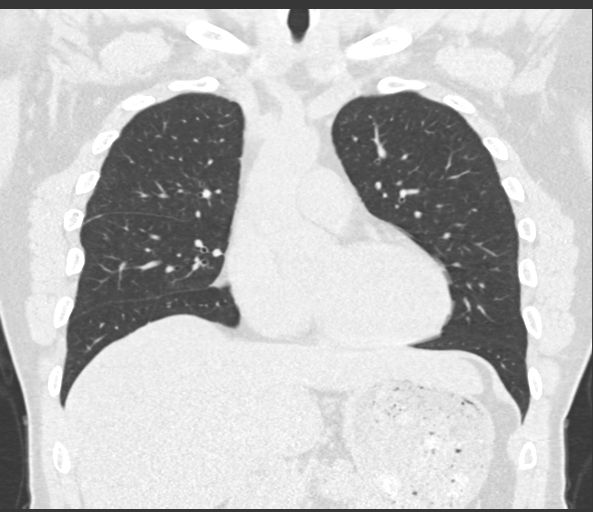
[im 76/126  lung]
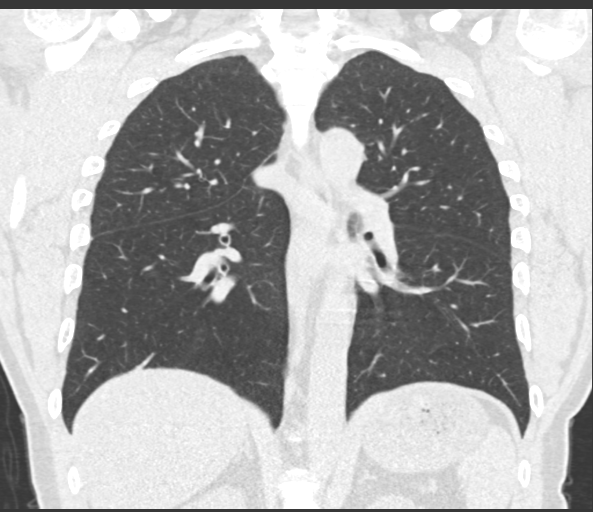

[15 of 36 positions shown; findings below may reference images not displayed]

FINDINGS: Cardiovascular: Normal heart size. No significant pericardial
effusion/thickening. Great vessels are normal in course and caliber.

Mediastinum/Nodes: No discrete thyroid nodules. Unremarkable
esophagus. No pathologically enlarged axillary, mediastinal or hilar
lymph nodes, noting limited sensitivity for the detection of hilar
adenopathy on this noncontrast study.

Lungs/Pleura: No pneumothorax. No pleural effusion. No acute
consolidative airspace disease, lung masses or significant pulmonary
nodules. No significant regions of subpleural reticulation,
ground-glass attenuation, traction bronchiectasis, architectural
distortion or frank honeycombing. No bullous changes or fissural
nodularity. No significant air trapping or evidence of
tracheobronchomalacia on the expiration sequence.

Upper abdomen: Mild diffuse hepatic steatosis.

Musculoskeletal:  No aggressive appearing focal osseous lesions.
IMPRESSION: 1. No active pulmonary disease. Specifically, no pulmonary
parenchymal findings of sarcoidosis.
2. No thoracic lymphadenopathy.
3. Mild diffuse hepatic steatosis.

## 2021-04-21 DIAGNOSIS — R972 Elevated prostate specific antigen [PSA]: Secondary | ICD-10-CM | POA: Diagnosis not present

## 2021-04-21 DIAGNOSIS — R5382 Chronic fatigue, unspecified: Secondary | ICD-10-CM | POA: Diagnosis not present

## 2021-04-21 DIAGNOSIS — Z125 Encounter for screening for malignant neoplasm of prostate: Secondary | ICD-10-CM | POA: Diagnosis not present

## 2021-04-21 DIAGNOSIS — Z0000 Encounter for general adult medical examination without abnormal findings: Secondary | ICD-10-CM | POA: Diagnosis not present

## 2021-04-21 DIAGNOSIS — F1010 Alcohol abuse, uncomplicated: Secondary | ICD-10-CM | POA: Diagnosis not present

## 2021-05-28 DIAGNOSIS — Z0000 Encounter for general adult medical examination without abnormal findings: Secondary | ICD-10-CM | POA: Diagnosis not present

## 2021-05-28 DIAGNOSIS — M79672 Pain in left foot: Secondary | ICD-10-CM | POA: Diagnosis not present

## 2021-05-28 DIAGNOSIS — R972 Elevated prostate specific antigen [PSA]: Secondary | ICD-10-CM | POA: Diagnosis not present

## 2021-05-28 DIAGNOSIS — I10 Essential (primary) hypertension: Secondary | ICD-10-CM | POA: Diagnosis not present

## 2021-05-28 DIAGNOSIS — R739 Hyperglycemia, unspecified: Secondary | ICD-10-CM | POA: Diagnosis not present

## 2021-06-16 ENCOUNTER — Ambulatory Visit (INDEPENDENT_AMBULATORY_CARE_PROVIDER_SITE_OTHER): Payer: BC Managed Care – PPO

## 2021-06-16 ENCOUNTER — Ambulatory Visit (INDEPENDENT_AMBULATORY_CARE_PROVIDER_SITE_OTHER): Payer: BC Managed Care – PPO | Admitting: Podiatry

## 2021-06-16 DIAGNOSIS — M792 Neuralgia and neuritis, unspecified: Secondary | ICD-10-CM | POA: Diagnosis not present

## 2021-06-16 DIAGNOSIS — M79671 Pain in right foot: Secondary | ICD-10-CM

## 2021-06-16 DIAGNOSIS — D3610 Benign neoplasm of peripheral nerves and autonomic nervous system, unspecified: Secondary | ICD-10-CM

## 2021-06-16 DIAGNOSIS — M7742 Metatarsalgia, left foot: Secondary | ICD-10-CM | POA: Diagnosis not present

## 2021-06-16 DIAGNOSIS — M79672 Pain in left foot: Secondary | ICD-10-CM

## 2021-06-16 DIAGNOSIS — M7741 Metatarsalgia, right foot: Secondary | ICD-10-CM

## 2021-06-16 MED ORDER — GABAPENTIN 100 MG PO CAPS: 100.0000 mg | ORAL_CAPSULE | Freq: Every day | ORAL | 0 refills | Status: DC

## 2021-06-16 NOTE — Progress Notes (Signed)
SITUATION ?Reason for Consult: Evaluation for Bilateral Custom Foot Orthoses ?Patient / Caregiver Report: Patient is ready for foot orthotics ? ?OBJECTIVE DATA: ?Patient History / Diagnosis:  ?  ICD-10-CM   ?1. Bilateral foot pain  M79.671   ? J62.831   ?  ? ? ?Current or Previous Devices:   Historical user ? ?Foot Examination: ?Skin presentation:   Intact ?Ulcers & Callousing:   None ?Toe / Foot Deformities:  None ?Weight Bearing Presentation:  Rectus ?Sensation:    Intact ? ?Shoe Size:    16M ? ?ORTHOTIC RECOMMENDATION ?Recommended Device: 1x pair of custom functional foot orthotics ? ?GOALS OF ORTHOSES ?- Reduce Pain ?- Prevent Foot Deformity ?- Prevent Progression of Further Foot Deformity ?- Relieve Pressure ?- Improve the Overall Biomechanical Function of the Foot and Lower Extremity. ? ?ACTIONS PERFORMED ?Potential out of pocket cost was communicated to patient. Patient understood and consent to casting. Patient was casted for Foot Orthoses via crush box. Procedure was explained and patient tolerated procedure well. Casts were shipped to central fabrication. All questions were answered and concerns addressed. ? ?PLAN ?Patient is to be called for fitting when devices are ready.  ? ? ?

## 2021-06-16 NOTE — Patient Instructions (Addendum)
You can use "Voltaren gel" on the feet as needed ? ?Gabapentin Capsules or Tablets ?What is this medication? ?GABAPENTIN (GA ba pen tin) treats nerve pain. It may also be used to prevent and control seizures in people with epilepsy. It works by calming overactive nerves in your body. ?This medicine may be used for other purposes; ask your health care provider or pharmacist if you have questions. ?COMMON BRAND NAME(S): Active-PAC with Gabapentin, Orpha Bur, Gralise, Neurontin ?What should I tell my care team before I take this medication? ?They need to know if you have any of these conditions: ?Alcohol or substance use disorder ?Kidney disease ?Lung or breathing disease ?Suicidal thoughts, plans, or attempt; a previous suicide attempt by you or a family member ?An unusual or allergic reaction to gabapentin, other medications, foods, dyes, or preservatives ?Pregnant or trying to get pregnant ?Breast-feeding ?How should I use this medication? ?Take this medication by mouth with a glass of water. Follow the directions on the prescription label. You can take it with or without food. If it upsets your stomach, take it with food. Take your medication at regular intervals. Do not take it more often than directed. Do not stop taking except on your care team's advice. ?If you are directed to break the 600 or 800 mg tablets in half as part of your dose, the extra half tablet should be used for the next dose. If you have not used the extra half tablet within 28 days, it should be thrown away. ?A special MedGuide will be given to you by the pharmacist with each prescription and refill. Be sure to read this information carefully each time. ?Talk to your care team about the use of this medication in children. While this medication may be prescribed for children as young as 3 years for selected conditions, precautions do apply. ?Overdosage: If you think you have taken too much of this medicine contact a poison control center or  emergency room at once. ?NOTE: This medicine is only for you. Do not share this medicine with others. ?What if I miss a dose? ?If you miss a dose, take it as soon as you can. If it is almost time for your next dose, take only that dose. Do not take double or extra doses. ?What may interact with this medication? ?Alcohol ?Antihistamines for allergy, cough, and cold ?Certain medications for anxiety or sleep ?Certain medications for depression like amitriptyline, fluoxetine, sertraline ?Certain medications for seizures like phenobarbital, primidone ?Certain medications for stomach problems ?General anesthetics like halothane, isoflurane, methoxyflurane, propofol ?Local anesthetics like lidocaine, pramoxine, tetracaine ?Medications that relax muscles for surgery ?Opioid medications for pain ?Phenothiazines like chlorpromazine, mesoridazine, prochlorperazine, thioridazine ?This list may not describe all possible interactions. Give your health care provider a list of all the medicines, herbs, non-prescription drugs, or dietary supplements you use. Also tell them if you smoke, drink alcohol, or use illegal drugs. Some items may interact with your medicine. ?What should I watch for while using this medication? ?Visit your care team for regular checks on your progress. You may want to keep a record at home of how you feel your condition is responding to treatment. You may want to share this information with your care team at each visit. You should contact your care team if your seizures get worse or if you have any new types of seizures. Do not stop taking this medication or any of your seizure medications unless instructed by your care team. Stopping your medication suddenly can increase  your seizures or their severity. ?This medication may cause serious skin reactions. They can happen weeks to months after starting the medication. Contact your care team right away if you notice fevers or flu-like symptoms with a rash. The  rash may be red or purple and then turn into blisters or peeling of the skin. Or, you might notice a red rash with swelling of the face, lips or lymph nodes in your neck or under your arms. ?Wear a medical identification bracelet or chain if you are taking this medication for seizures. Carry a card that lists all your medications. ?This medication may affect your coordination, reaction time, or judgment. Do not drive or operate machinery until you know how this medication affects you. Sit up or stand slowly to reduce the risk of dizzy or fainting spells. Drinking alcohol with this medication can increase the risk of these side effects. ?Your mouth may get dry. Chewing sugarless gum or sucking hard candy, and drinking plenty of water may help. ?Watch for new or worsening thoughts of suicide or depression. This includes sudden changes in mood, behaviors, or thoughts. These changes can happen at any time but are more common in the beginning of treatment or after a change in dose. Call your care team right away if you experience these thoughts or worsening depression. ?If you become pregnant while using this medication, you may enroll in the [place] Pregnancy Registry by calling [phone]. This registry collects information about the safety of antiepileptic medication use during pregnancy. ?What side effects may I notice from receiving this medication? ?Side effects that you should report to your care team as soon as possible: ?Allergic reactions or angioedema--skin rash, itching, hives, swelling of the face, eyes, lips, tongue, arms, or legs, trouble swallowing or breathing ?Rash, fever, and swollen lymph nodes ?Thoughts of suicide or self harm, worsening mood, feelings of depression ?Trouble breathing ?Unusual changes in mood or behavior in children after use such as difficulty concentrating, hostility, or restlessness ?Side effects that usually do not require medical attention (report  to your care team if they continue or are bothersome): ?Dizziness ?Drowsiness ?Nausea ?Swelling of ankles, feet, or hands ?Vomiting ?This list may not describe all possible side effects. Call your doctor for medical advice about side effects. You may report side effects to FDA at 1-800-FDA-1088. ?Where should I keep my medication? ?Keep out of reach of children and pets. ?Store at room temperature between 15 and 30 degrees C (59 and 86 degrees F). Get rid of any unused medication after the expiration date. ?This medication may cause accidental overdose and death if taken by other adults, children, or pets. ?To get rid of medications that are no longer needed or have expired: ?Take the medication to a medication take-back program. Check with your pharmacy or law enforcement to find a location. ?If you cannot return the medication, check the label or package insert to see if the medication should be thrown out in the garbage or flushed down the toilet. If you are not sure, ask your care team. If it is safe to put it in the trash, empty the medication out of the container. Mix the medication with cat litter, dirt, coffee grounds, or other unwanted substance. Seal the mixture in a bag or container. Put it in the trash. ?NOTE: This sheet is a summary. It may not cover all possible information. If you have questions about this medicine, talk to your doctor, pharmacist, or health care provider. ?? 2023  Elsevier/Gold Standard (2020-07-29 00:00:00) ? ?
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
## 2021-07-08 ENCOUNTER — Other Ambulatory Visit: Payer: Self-pay | Admitting: Podiatry

## 2021-07-08 DIAGNOSIS — M7741 Metatarsalgia, right foot: Secondary | ICD-10-CM

## 2021-07-14 ENCOUNTER — Ambulatory Visit: Payer: BC Managed Care – PPO

## 2021-07-14 NOTE — Progress Notes (Signed)
SITUATION: Reason for Visit: Fitting and Delivery of Custom Fabricated Foot Orthoses Patient Report: Patient reports comfort and is satisfied with device.  OBJECTIVE DATA: Patient History / Diagnosis:   No diagnosis found.  Provided Device:  Custom Functional Foot Orthotics     [facility]: [id number]  GOAL OF ORTHOSIS - Improve gait - Decrease energy expenditure - Improve Balance - Provide Triplanar stability of foot complex - Facilitate motion  ACTIONS PERFORMED Patient was fit with foot orthotics trimmed to shoe last. Patient tolerated fittign procedure.   Patient was provided with verbal and written instruction and demonstration regarding donning, doffing, wear, care, proper fit, function, purpose, cleaning, and use of the orthosis and in all related precautions and risks and benefits regarding the orthosis.  Patient was also provided with verbal instruction regarding how to report any failures or malfunctions of the orthosis and necessary follow up care. Patient was also instructed to contact our office regarding any change in status that may affect the function of the orthosis.  Patient demonstrated independence with proper donning, doffing, and fit and verbalized understanding of all instructions.  PLAN: Patient is to follow up in one week or as necessary (PRN). All questions were answered and concerns addressed. Plan of care was discussed with and agreed upon by the patient.

## 2021-07-21 DIAGNOSIS — R972 Elevated prostate specific antigen [PSA]: Secondary | ICD-10-CM | POA: Diagnosis not present

## 2021-07-28 ENCOUNTER — Ambulatory Visit: Payer: BC Managed Care – PPO | Admitting: Podiatry

## 2021-08-25 ENCOUNTER — Ambulatory Visit (INDEPENDENT_AMBULATORY_CARE_PROVIDER_SITE_OTHER): Payer: BC Managed Care – PPO | Admitting: Podiatry

## 2021-08-25 DIAGNOSIS — M7741 Metatarsalgia, right foot: Secondary | ICD-10-CM

## 2021-08-25 DIAGNOSIS — M62838 Other muscle spasm: Secondary | ICD-10-CM | POA: Diagnosis not present

## 2021-08-25 DIAGNOSIS — M6289 Other specified disorders of muscle: Secondary | ICD-10-CM | POA: Diagnosis not present

## 2021-08-25 DIAGNOSIS — R972 Elevated prostate specific antigen [PSA]: Secondary | ICD-10-CM | POA: Diagnosis not present

## 2021-08-25 DIAGNOSIS — M6281 Muscle weakness (generalized): Secondary | ICD-10-CM | POA: Diagnosis not present

## 2021-08-25 DIAGNOSIS — D3610 Benign neoplasm of peripheral nerves and autonomic nervous system, unspecified: Secondary | ICD-10-CM

## 2021-08-25 DIAGNOSIS — M7742 Metatarsalgia, left foot: Secondary | ICD-10-CM | POA: Diagnosis not present

## 2021-08-25 NOTE — Progress Notes (Signed)
Subjective: 46 year old male presents the office today for evaluation of bilateral foot pain with the left side  worse than right.  He states that he has been on the meloxicam and gabapentin as well but this was causing GI upset so we have stopped this.  Previously injection his feet which were helpful temporarily.  Everything that he has tried helps for short amount of time does not limit the symptoms.  He is also seen neurology for his back pain so he had herniated disc in his back and is also had injections in his back as well.  This also helped for short time.  He has not followed up with neurology recently.  Objective: AAO x3, NAD DP/PT pulses palpable bilaterally, CRT less than 3 seconds There is currently no area pinpoint tenderness identified bilaterally.  There is no edema, erythema.  Flexor, extensor tendons appear to be intact.  No pain with MPJ range of motion. No pain with calf compression, swelling, warmth, erythema  Assessment: Rule out neuromas bilaterally  Plan: -All treatment options discussed with the patient including all alternatives, risks, complications.  -MRI bilateral feet for any other underlying pathology.  I think his symptoms could also be coming from his back.  If the MRI is normal we will refer back to spine specialist or neurology. -Patient encouraged to call the office with any questions, concerns, change in symptoms.   Trula Slade DPM

## 2021-09-01 DIAGNOSIS — R972 Elevated prostate specific antigen [PSA]: Secondary | ICD-10-CM | POA: Diagnosis not present

## 2021-09-01 DIAGNOSIS — N411 Chronic prostatitis: Secondary | ICD-10-CM | POA: Diagnosis not present

## 2021-09-06 ENCOUNTER — Ambulatory Visit
Admission: RE | Admit: 2021-09-06 | Discharge: 2021-09-06 | Disposition: A | Discharge status: Home / Self Care | Payer: BC Managed Care – PPO | Source: Ambulatory Visit | Attending: Podiatry | Admitting: Podiatry

## 2021-09-06 DIAGNOSIS — M7742 Metatarsalgia, left foot: Secondary | ICD-10-CM

## 2021-09-06 DIAGNOSIS — M7989 Other specified soft tissue disorders: Secondary | ICD-10-CM | POA: Diagnosis not present

## 2021-09-06 DIAGNOSIS — D3610 Benign neoplasm of peripheral nerves and autonomic nervous system, unspecified: Secondary | ICD-10-CM

## 2021-09-06 DIAGNOSIS — M71572 Other bursitis, not elsewhere classified, left ankle and foot: Secondary | ICD-10-CM | POA: Diagnosis not present

## 2021-09-06 DIAGNOSIS — M19072 Primary osteoarthritis, left ankle and foot: Secondary | ICD-10-CM | POA: Diagnosis not present

## 2021-09-06 DIAGNOSIS — M19071 Primary osteoarthritis, right ankle and foot: Secondary | ICD-10-CM | POA: Diagnosis not present

## 2021-09-06 DIAGNOSIS — M71571 Other bursitis, not elsewhere classified, right ankle and foot: Secondary | ICD-10-CM | POA: Diagnosis not present

## 2021-09-06 DIAGNOSIS — R600 Localized edema: Secondary | ICD-10-CM | POA: Diagnosis not present

## 2021-09-10 ENCOUNTER — Other Ambulatory Visit: Payer: Self-pay | Admitting: Podiatry

## 2021-09-29 ENCOUNTER — Ambulatory Visit (INDEPENDENT_AMBULATORY_CARE_PROVIDER_SITE_OTHER): Payer: BC Managed Care – PPO | Admitting: Podiatry

## 2021-09-29 DIAGNOSIS — D3610 Benign neoplasm of peripheral nerves and autonomic nervous system, unspecified: Secondary | ICD-10-CM | POA: Diagnosis not present

## 2021-09-29 DIAGNOSIS — M722 Plantar fascial fibromatosis: Secondary | ICD-10-CM | POA: Diagnosis not present

## 2021-09-29 DIAGNOSIS — M792 Neuralgia and neuritis, unspecified: Secondary | ICD-10-CM

## 2021-09-29 MED ORDER — TRIAMCINOLONE ACETONIDE 10 MG/ML IJ SUSP: 10.0000 mg | Freq: Once | INTRAMUSCULAR | Status: AC

## 2021-09-29 NOTE — Patient Instructions (Signed)

## 2021-10-04 NOTE — Progress Notes (Signed)
Subjective: Chief Complaint  Patient presents with   Foot Pain    Pt came in for a follow-up and MRI results, pt is still having bilateral foot, left foot is worse than the right, burning and itching, pt rates the pain a 8 out of 10, pt is no longer taking meloxicam and gabapentin. Pt it doing the same as last visit,    46 year old male presents the office today to discuss MRI results.  He still states he has ongoing foot pain and describes burning, itching sensations.  States that is the "entire foot".  When asked how far out the nerve symptoms go he points to the ankle encompasses the entire foot.  He also gets pain in certain areas of his feet.  Objective: AAO x3, NAD DP/PT pulses palpable bilaterally, CRT less than 3 seconds Sensation appears to be intact today with Thornell Mule monofilament.  There is tenderness palpation on any of plantar fibromas bilaterally.  There is no specific area pinpoint tenderness.  Ankle, subtalar joint range of motion intact for any restrictions.  MMT 5/5. No pain with calf compression, swelling, warmth, erythema  Assessment: 46 year old male with plantar fibromas, right foot neuroma concern for neuropathy  Plan: -All treatment options discussed with the patient including all alternatives, risks, complications.  -I discussed with the patient to discuss the MRI.  I do think the plantar fibromas and possibly neuroma is causing pain but should not be causing the burning and nerve symptoms encompassing the entire foot symmetrically and bilaterally.  We will start with treating the foot but also discussed possible referral to neurology.  Today steroid injections were performed to the plantar fibromas present bilaterally.  Skin skin with alcohol and mixture of 1 cc Kenalog 10, 0.5 cc of Marcaine plain, 0.5 cc of lidocaine plain was infiltrated bilaterally into the area of tenderness on the plantar fibromas.  Tolerated well.  Postinjection care discussed. -Discussed  stretching, icing on a regular basis.  Discussed shoe modifications and good arch support.  Trula Slade DPM  -Patient encouraged to call the office with any questions, concerns, change in symptoms.

## 2021-10-24 DIAGNOSIS — R229 Localized swelling, mass and lump, unspecified: Secondary | ICD-10-CM | POA: Diagnosis not present

## 2021-10-24 DIAGNOSIS — M25571 Pain in right ankle and joints of right foot: Secondary | ICD-10-CM | POA: Diagnosis not present

## 2021-10-24 DIAGNOSIS — R262 Difficulty in walking, not elsewhere classified: Secondary | ICD-10-CM | POA: Diagnosis not present

## 2021-10-24 DIAGNOSIS — M25572 Pain in left ankle and joints of left foot: Secondary | ICD-10-CM | POA: Diagnosis not present

## 2021-11-01 DIAGNOSIS — M25572 Pain in left ankle and joints of left foot: Secondary | ICD-10-CM | POA: Diagnosis not present

## 2021-11-01 DIAGNOSIS — R262 Difficulty in walking, not elsewhere classified: Secondary | ICD-10-CM | POA: Diagnosis not present

## 2021-11-01 DIAGNOSIS — R229 Localized swelling, mass and lump, unspecified: Secondary | ICD-10-CM | POA: Diagnosis not present

## 2021-11-01 DIAGNOSIS — M25571 Pain in right ankle and joints of right foot: Secondary | ICD-10-CM | POA: Diagnosis not present

## 2021-11-15 DIAGNOSIS — M25571 Pain in right ankle and joints of right foot: Secondary | ICD-10-CM | POA: Diagnosis not present

## 2021-11-15 DIAGNOSIS — R229 Localized swelling, mass and lump, unspecified: Secondary | ICD-10-CM | POA: Diagnosis not present

## 2021-11-15 DIAGNOSIS — R262 Difficulty in walking, not elsewhere classified: Secondary | ICD-10-CM | POA: Diagnosis not present

## 2021-11-15 DIAGNOSIS — M25572 Pain in left ankle and joints of left foot: Secondary | ICD-10-CM | POA: Diagnosis not present

## 2021-11-21 ENCOUNTER — Ambulatory Visit: Payer: BC Managed Care – PPO | Admitting: Podiatry

## 2022-02-24 DIAGNOSIS — R972 Elevated prostate specific antigen [PSA]: Secondary | ICD-10-CM | POA: Diagnosis not present

## 2022-03-03 DIAGNOSIS — R972 Elevated prostate specific antigen [PSA]: Secondary | ICD-10-CM | POA: Diagnosis not present

## 2022-03-31 DIAGNOSIS — C61 Malignant neoplasm of prostate: Secondary | ICD-10-CM | POA: Diagnosis not present

## 2022-04-09 DIAGNOSIS — C61 Malignant neoplasm of prostate: Secondary | ICD-10-CM | POA: Diagnosis not present

## 2022-07-22 ENCOUNTER — Other Ambulatory Visit: Payer: Self-pay | Admitting: Urology

## 2022-07-22 DIAGNOSIS — C61 Malignant neoplasm of prostate: Secondary | ICD-10-CM

## 2022-10-09 DIAGNOSIS — C61 Malignant neoplasm of prostate: Secondary | ICD-10-CM | POA: Diagnosis not present

## 2022-10-11 ENCOUNTER — Ambulatory Visit
Admission: RE | Admit: 2022-10-11 | Discharge: 2022-10-11 | Disposition: A | Discharge status: Home / Self Care | Payer: BC Managed Care – PPO | Source: Ambulatory Visit | Attending: Urology | Admitting: Urology

## 2022-10-11 DIAGNOSIS — C61 Malignant neoplasm of prostate: Secondary | ICD-10-CM

## 2022-10-11 MED ORDER — GADOPICLENOL 0.5 MMOL/ML IV SOLN
10.0000 mL | Freq: Once | INTRAVENOUS | Status: AC | PRN
  Administered 2022-10-11: 10 mL via INTRAVENOUS

## 2022-10-27 DIAGNOSIS — C61 Malignant neoplasm of prostate: Secondary | ICD-10-CM | POA: Diagnosis not present

## 2023-03-08 ENCOUNTER — Other Ambulatory Visit: Payer: Self-pay | Admitting: Urology

## 2023-03-08 DIAGNOSIS — C61 Malignant neoplasm of prostate: Secondary | ICD-10-CM | POA: Diagnosis not present

## 2023-04-14 DIAGNOSIS — C61 Malignant neoplasm of prostate: Secondary | ICD-10-CM | POA: Diagnosis not present

## 2023-04-14 DIAGNOSIS — R351 Nocturia: Secondary | ICD-10-CM | POA: Diagnosis not present

## 2023-04-14 DIAGNOSIS — M6281 Muscle weakness (generalized): Secondary | ICD-10-CM | POA: Diagnosis not present

## 2023-04-16 DIAGNOSIS — M6281 Muscle weakness (generalized): Secondary | ICD-10-CM | POA: Diagnosis not present

## 2023-04-16 DIAGNOSIS — R351 Nocturia: Secondary | ICD-10-CM | POA: Diagnosis not present

## 2023-04-16 DIAGNOSIS — C61 Malignant neoplasm of prostate: Secondary | ICD-10-CM | POA: Diagnosis not present

## 2023-04-22 NOTE — Progress Notes (Addendum)
 PCP - no Cardiologist - no PULM-LOV 10-31-18 epic Elisha Headland, NP  no longer see saw a different Dr. Jaye Beagle of [facility] that said he did not have sarcadosis  PPM/ICD -  Device Orders -  Rep Notified -   Chest x-ray -  EKG - 04-23-23 Stress Test -  ECHO -  Cardiac Cath -   Sleep Study -  CPAP -   Fasting Blood Sugar -  Checks Blood Sugar _____ times a day  Blood Thinner Instructions: Aspirin Instructions:  ERAS Protcol - PRE-SURGERY   COVID vaccine -  Activity--Able to climb a flight of stairs with no Cp or SOB Anesthesia review:? Sarcoidosis no issues, pt. Was told yes by one doctor and no by another. BP elevated at preop no Hx. Of HTN. Shanda Bumps Ward PA-C aware. Pt. Advised to see a PCP or urgent care before surgery to get HTN meds before surgery.  Patient denies shortness of breath, fever, cough and chest pain at PAT appointment   All instructions explained to the patient, with a verbal understanding of the material. Patient agrees to go over the instructions while at home for a better understanding. Patient also instructed to self quarantine after being tested for COVID-19. The opportunity to ask questions was provided.

## 2023-04-22 NOTE — Patient Instructions (Signed)
 SURGICAL WAITING ROOM VISITATION  Patients having surgery or a procedure may have no more than 2 support people in the waiting area - these visitors may rotate.    Children under the age of 72 must have an adult with them who is not the patient.  Due to an increase in RSV and influenza rates and associated hospitalizations, children ages 72 and under may not visit patients in [facility] hospitals.  Visitors with respiratory illnesses are discouraged from visiting and should remain at home.  If the patient needs to stay at the hospital during part of their recovery, the visitor guidelines for inpatient rooms apply. Pre-op nurse will coordinate an appropriate time for 1 support person to accompany patient in pre-op.  This support person may not rotate.    Please refer to the [facility] website for the visitor guidelines for Inpatients (after your surgery is over and you are in a regular room).       Your procedure is scheduled on: 04-30-23   Report to [facility] Main Entrance    Report to admitting at     0515 AM   Call this number if you have problems the morning of surgery [phone]   FOLLOW A CLEAR LIQUID DIET THE DAY BEFORE SURGERY NOTHING  BY MOUTH AFTER MIDNIGHT      Water Non-Citrus Juices (without pulp, NO RED-Apple, White grape, White cranberry) Black Coffee (NO MILK/CREAM OR CREAMERS, sugar ok)  Clear Tea (NO MILK/CREAM OR CREAMERS, sugar ok) regular and decaf                             Plain Jell-O (NO RED)                                           Fruit ices (not with fruit pulp, NO RED)                                     Popsicles (NO RED)                                                               Sports drinks like Gatorade (NO RED)                        If you have questions, please contact your surgeon's office.   FOLLOW BOWEL PREP AND ANY ADDITIONAL PRE OP INSTRUCTIONS YOU RECEIVED FROM YOUR SURGEON'S OFFICE!!!               ONE FLEETS  ENEMA THE NIGHT BEFORE SURGERY   Oral Hygiene is also important to reduce your risk of infection.                                    Remember - BRUSH YOUR TEETH THE MORNING OF SURGERY WITH YOUR REGULAR TOOTHPASTE  DENTURES WILL BE REMOVED PRIOR TO SURGERY PLEASE DO NOT APPLY "Poly grip" OR ADHESIVES!!!  Do NOT smoke after Midnight   Stop all vitamins and herbal supplements 7 days before surgery.   Take these medicines the morning of surgery with A SIP OF WATER: none                               You may not have any metal on your body including hair pins, jewelry, and body piercing             Do not wear , lotions, powders, cologne, or deodorant .               Men may shave face and neck.   Do not bring valuables to the hospital. [facility] IS NOT             RESPONSIBLE   FOR VALUABLES.   Contacts, glasses, dentures or bridgework may not be worn into surgery.   Bring small overnight bag day of surgery.   DO NOT BRING YOUR HOME MEDICATIONS TO THE HOSPITAL. PHARMACY WILL DISPENSE MEDICATIONS LISTED ON YOUR MEDICATION LIST TO YOU DURING YOUR ADMISSION IN THE HOSPITAL!    Patients discharged on the day of surgery will not be allowed to drive home.  Someone NEEDS to stay with you for the first 24 hours after anesthesia.   Special Instructions: Bring a copy of your healthcare power of attorney and living will documents the day of surgery if you haven't scanned them before.              Please read over the following fact sheets you were given: IF YOU HAVE QUESTIONS ABOUT YOUR PRE-OP INSTRUCTIONS PLEASE CALL [phone]   I If you test positive for Covid or have been in contact with anyone that has tested positive in the last 10 days please notify you surgeon.    [facility] - Preparing for Surgery Before surgery, you can play an important role.  Because skin is not sterile, your skin needs to be as free of germs as possible.  You can reduce the number of germs on your skin by  washing with CHG (chlorahexidine gluconate) soap before surgery.  CHG is an antiseptic cleaner which kills germs and bonds with the skin to continue killing germs even after washing. Please DO NOT use if you have an allergy to CHG or antibacterial soaps.  If your skin becomes reddened/irritated stop using the CHG and inform your nurse when you arrive at Short Stay. Do not shave (including legs and underarms) for at least 48 hours prior to the first CHG shower.  You may shave your face/neck. Please follow these instructions carefully:  1.  Shower with CHG Soap the night before surgery and the  morning of Surgery.  2.  If you choose to wash your hair, wash your hair first as usual with your  normal  shampoo.  3.  After you shampoo, rinse your hair and body thoroughly to remove the  shampoo.                           4.  Use CHG as you would any other liquid soap.  You can apply chg directly  to the skin and wash                       Gently with a scrungie or clean washcloth.  5.  Apply the CHG  Soap to your body ONLY FROM THE NECK DOWN.   Do not use on face/ open                           Wound or open sores. Avoid contact with eyes, ears mouth and genitals (private parts).                       Wash face,  Genitals (private parts) with your normal soap.             6.  Wash thoroughly, paying special attention to the area where your surgery  will be performed.  7.  Thoroughly rinse your body with warm water from the neck down.  8.  DO NOT shower/wash with your normal soap after using and rinsing off  the CHG Soap.                9.  Pat yourself dry with a clean towel.            10.  Wear clean pajamas.            11.  Place clean sheets on your bed the night of your first shower and do not  sleep with pets. Day of Surgery : Do not apply any lotions/deodorants the morning of surgery.  Please wear clean clothes to the hospital/surgery center.  FAILURE TO FOLLOW THESE INSTRUCTIONS MAY RESULT IN THE  CANCELLATION OF YOUR SURGERY PATIENT SIGNATURE_________________________________  NURSE SIGNATURE__________________________________  ________________________________________________________________________

## 2023-04-23 ENCOUNTER — Other Ambulatory Visit: Payer: Self-pay

## 2023-04-23 ENCOUNTER — Encounter (HOSPITAL_COMMUNITY)
Admission: RE | Admit: 2023-04-23 | Discharge: 2023-04-23 | Disposition: A | Discharge status: Home / Self Care | Payer: BC Managed Care – PPO | Source: Ambulatory Visit | Attending: Urology | Admitting: Urology

## 2023-04-23 ENCOUNTER — Encounter (HOSPITAL_COMMUNITY): Payer: Self-pay

## 2023-04-23 VITALS — HR 71 | Temp 98.6°F | Resp 16 | Ht 71.0 in | Wt 252.0 lb

## 2023-04-23 DIAGNOSIS — Z01818 Encounter for other preprocedural examination: Secondary | ICD-10-CM | POA: Diagnosis not present

## 2023-04-23 DIAGNOSIS — I10 Essential (primary) hypertension: Secondary | ICD-10-CM | POA: Diagnosis not present

## 2023-04-23 HISTORY — DX: Malignant (primary) neoplasm, unspecified: C80.1

## 2023-04-23 LAB — BASIC METABOLIC PANEL
Anion gap: 8 (ref 5–15)
BUN: 22 mg/dL — ABNORMAL HIGH (ref 6–20)
CO2: 24 mmol/L (ref 22–32)
Calcium: 9.2 mg/dL (ref 8.9–10.3)
Chloride: 106 mmol/L (ref 98–111)
Creatinine, Ser: 1.24 mg/dL (ref 0.61–1.24)
GFR, Estimated: >60 mL/min (ref >60)
Glucose, Bld: 106 mg/dL — ABNORMAL HIGH (ref 70–99)
Potassium: 4.3 mmol/L (ref 3.5–5.1)
Sodium: 138 mmol/L (ref 135–145)

## 2023-04-23 LAB — CBC
HCT: 41.9 % (ref 39.0–52.0)
Hemoglobin: 13.6 g/dL (ref 13.0–17.0)
MCH: 30.4 pg (ref 26.0–34.0)
MCHC: 32.5 g/dL (ref 30.0–36.0)
MCV: 93.5 fL (ref 80.0–100.0)
Platelets: 310 10*3/uL (ref 150–400)
RBC: 4.48 MIL/uL (ref 4.22–5.81)
RDW: 12.8 % (ref 11.5–15.5)
WBC: 4 10*3/uL (ref 4.0–10.5)
nRBC: 0 % (ref 0.0–0.2)

## 2023-04-30 ENCOUNTER — Ambulatory Visit (HOSPITAL_COMMUNITY): Admitting: Anesthesiology

## 2023-04-30 ENCOUNTER — Encounter (HOSPITAL_COMMUNITY)
Admission: RE | Disposition: A | Discharge status: Home / Self Care | Payer: Self-pay | Source: Ambulatory Visit | Attending: Urology

## 2023-04-30 ENCOUNTER — Ambulatory Visit (HOSPITAL_COMMUNITY): Payer: Self-pay | Admitting: Physician Assistant

## 2023-04-30 ENCOUNTER — Other Ambulatory Visit: Payer: Self-pay

## 2023-04-30 ENCOUNTER — Ambulatory Visit (HOSPITAL_COMMUNITY)
Admission: RE | Admit: 2023-04-30 | Discharge: 2023-04-30 | Disposition: A | Discharge status: Home / Self Care | Payer: BC Managed Care – PPO | Source: Ambulatory Visit | Attending: Urology | Admitting: Urology

## 2023-04-30 ENCOUNTER — Encounter (HOSPITAL_COMMUNITY): Payer: Self-pay | Admitting: Urology

## 2023-04-30 DIAGNOSIS — Z6835 Body mass index (BMI) 35.0-35.9, adult: Secondary | ICD-10-CM | POA: Diagnosis not present

## 2023-04-30 DIAGNOSIS — Z538 Procedure and treatment not carried out for other reasons: Secondary | ICD-10-CM | POA: Insufficient documentation

## 2023-04-30 DIAGNOSIS — N529 Male erectile dysfunction, unspecified: Secondary | ICD-10-CM | POA: Insufficient documentation

## 2023-04-30 DIAGNOSIS — C61 Malignant neoplasm of prostate: Secondary | ICD-10-CM | POA: Insufficient documentation

## 2023-04-30 DIAGNOSIS — E66813 Obesity, class 3: Secondary | ICD-10-CM | POA: Diagnosis not present

## 2023-04-30 DIAGNOSIS — Z79899 Other long term (current) drug therapy: Secondary | ICD-10-CM | POA: Insufficient documentation

## 2023-04-30 HISTORY — PX: ROBOT ASSISTED LAPAROSCOPIC RADICAL PROSTATECTOMY: SHX5141

## 2023-04-30 LAB — TYPE AND SCREEN
ABO/RH(D): O POS
Antibody Screen: NEGATIVE

## 2023-04-30 LAB — ABO/RH: ABO/RH(D): O POS

## 2023-04-30 SURGERY — PROSTATECTOMY, RADICAL, ROBOT-ASSISTED, LAPAROSCOPIC
Anesthesia: General

## 2023-04-30 MED ORDER — SUGAMMADEX SODIUM 200 MG/2ML IV SOLN
INTRAVENOUS | Status: DC | PRN
  Administered 2023-04-30: 250 mg via INTRAVENOUS

## 2023-04-30 MED ORDER — FLEET ENEMA RE ENEM: 1.0000 | ENEMA | RECTAL | Status: DC

## 2023-04-30 MED ORDER — OXYCODONE HCL 5 MG/5ML PO SOLN: 5.0000 mg | Freq: Once | ORAL | Status: DC | PRN

## 2023-04-30 MED ORDER — SODIUM CHLORIDE (PF) 0.9 % IJ SOLN
INTRAMUSCULAR | Status: AC
  Filled 2023-04-30: qty 20

## 2023-04-30 MED ORDER — LACTATED RINGERS IV SOLN: INTRAVENOUS | Status: DC

## 2023-04-30 MED ORDER — LACTATED RINGERS IV SOLN: INTRAVENOUS | Status: DC | PRN

## 2023-04-30 MED ORDER — FENTANYL CITRATE (PF) 250 MCG/5ML IJ SOLN
INTRAMUSCULAR | Status: AC
  Filled 2023-04-30: qty 5

## 2023-04-30 MED ORDER — PROPOFOL 10 MG/ML IV BOLUS
INTRAVENOUS | Status: AC
  Filled 2023-04-30: qty 20

## 2023-04-30 MED ORDER — ONDANSETRON HCL 4 MG/2ML IJ SOLN
INTRAMUSCULAR | Status: DC | PRN
  Administered 2023-04-30: 4 mg via INTRAVENOUS

## 2023-04-30 MED ORDER — ONDANSETRON HCL 4 MG/2ML IJ SOLN: 4.0000 mg | Freq: Four times a day (QID) | INTRAMUSCULAR | Status: DC | PRN

## 2023-04-30 MED ORDER — ORAL CARE MOUTH RINSE: 15.0000 mL | Freq: Once | OROMUCOSAL | Status: AC

## 2023-04-30 MED ORDER — LIDOCAINE HCL (CARDIAC) PF 100 MG/5ML IV SOSY
PREFILLED_SYRINGE | INTRAVENOUS | Status: DC | PRN
  Administered 2023-04-30: 100 mg via INTRAVENOUS

## 2023-04-30 MED ORDER — CEFAZOLIN SODIUM-DEXTROSE 2-4 GM/100ML-% IV SOLN
2.0000 g | INTRAVENOUS | Status: AC
  Administered 2023-04-30: 2 g via INTRAVENOUS
  Filled 2023-04-30: qty 100

## 2023-04-30 MED ORDER — OXYCODONE HCL 5 MG PO TABS: 5.0000 mg | ORAL_TABLET | Freq: Once | ORAL | Status: DC | PRN

## 2023-04-30 MED ORDER — CHLORHEXIDINE GLUCONATE 0.12 % MT SOLN
15.0000 mL | Freq: Once | OROMUCOSAL | Status: AC
  Administered 2023-04-30: 15 mL via OROMUCOSAL

## 2023-04-30 MED ORDER — FENTANYL CITRATE PF 50 MCG/ML IJ SOSY: 25.0000 ug | PREFILLED_SYRINGE | INTRAMUSCULAR | Status: DC | PRN

## 2023-04-30 MED ORDER — BUPIVACAINE-EPINEPHRINE 0.5% -1:200000 IJ SOLN: INTRAMUSCULAR | Status: DC | PRN

## 2023-04-30 MED ORDER — MIDAZOLAM HCL 2 MG/2ML IJ SOLN
INTRAMUSCULAR | Status: AC
  Filled 2023-04-30: qty 2

## 2023-04-30 MED ORDER — FENTANYL CITRATE (PF) 250 MCG/5ML IJ SOLN
INTRAMUSCULAR | Status: DC | PRN
  Administered 2023-04-30: 100 ug via INTRAVENOUS
  Administered 2023-04-30: 50 ug via INTRAVENOUS
  Administered 2023-04-30: 100 ug via INTRAVENOUS

## 2023-04-30 MED ORDER — SUGAMMADEX SODIUM 200 MG/2ML IV SOLN
INTRAVENOUS | Status: AC
  Filled 2023-04-30: qty 4

## 2023-04-30 MED ORDER — PHENYLEPHRINE 80 MCG/ML (10ML) SYRINGE FOR IV PUSH (FOR BLOOD PRESSURE SUPPORT)
PREFILLED_SYRINGE | INTRAVENOUS | Status: DC | PRN
  Administered 2023-04-30 (×5): 160 ug via INTRAVENOUS

## 2023-04-30 MED ORDER — BUPIVACAINE-EPINEPHRINE (PF) 0.5% -1:200000 IJ SOLN
INTRAMUSCULAR | Status: AC
  Filled 2023-04-30: qty 30

## 2023-04-30 MED ORDER — KETAMINE HCL 10 MG/ML IJ SOLN
INTRAMUSCULAR | Status: DC | PRN
  Administered 2023-04-30: 30 mg via INTRAVENOUS

## 2023-04-30 MED ORDER — ROCURONIUM BROMIDE 10 MG/ML (PF) SYRINGE
PREFILLED_SYRINGE | INTRAVENOUS | Status: DC | PRN
  Administered 2023-04-30: 60 mg via INTRAVENOUS

## 2023-04-30 MED ORDER — PROPOFOL 10 MG/ML IV BOLUS
INTRAVENOUS | Status: DC | PRN
  Administered 2023-04-30 (×2): 100 mg via INTRAVENOUS
  Administered 2023-04-30: 200 mg via INTRAVENOUS

## 2023-04-30 MED ORDER — BUPIVACAINE LIPOSOME 1.3 % IJ SUSP
INTRAMUSCULAR | Status: AC
  Filled 2023-04-30: qty 20

## 2023-04-30 MED ORDER — DEXAMETHASONE SODIUM PHOSPHATE 10 MG/ML IJ SOLN
INTRAMUSCULAR | Status: AC
  Filled 2023-04-30: qty 1

## 2023-04-30 MED ORDER — LIDOCAINE HCL (PF) 2 % IJ SOLN
INTRAMUSCULAR | Status: AC
  Filled 2023-04-30: qty 5

## 2023-04-30 MED ORDER — MIDAZOLAM HCL 5 MG/5ML IJ SOLN
INTRAMUSCULAR | Status: DC | PRN
Start: 2023-04-30 — End: 2023-04-30
  Administered 2023-04-30: 2 mg via INTRAVENOUS

## 2023-04-30 MED ORDER — DEXAMETHASONE SODIUM PHOSPHATE 10 MG/ML IJ SOLN
INTRAMUSCULAR | Status: DC | PRN
  Administered 2023-04-30: 10 mg via INTRAVENOUS

## 2023-04-30 MED ORDER — KETAMINE HCL 50 MG/5ML IJ SOSY
PREFILLED_SYRINGE | INTRAMUSCULAR | Status: AC
  Filled 2023-04-30: qty 5

## 2023-04-30 MED ORDER — ONDANSETRON HCL 4 MG/2ML IJ SOLN
INTRAMUSCULAR | Status: AC
Start: 2023-04-30 — End: ?
  Filled 2023-04-30: qty 2

## 2023-04-30 SURGICAL SUPPLY — 57 items
APPLICATOR COTTON TIP 6 STRL (MISCELLANEOUS) ×3 IMPLANT
APPLICATOR COTTON TIP 6IN STRL (MISCELLANEOUS) IMPLANT
BAG COUNTER SPONGE SURGICOUNT (BAG) IMPLANT
CATH FOLEY 2WAY SLVR 18FR 30CC (CATHETERS) ×3 IMPLANT
CATH TIEMANN FOLEY 18FR 5CC (CATHETERS) ×3 IMPLANT
CHLORAPREP W/TINT 26 (MISCELLANEOUS) ×3 IMPLANT
CLIP LIGATING HEM O LOK PURPLE (MISCELLANEOUS) ×3 IMPLANT
COVER SURGICAL LIGHT HANDLE (MISCELLANEOUS) ×3 IMPLANT
COVER TIP SHEARS 8 DVNC (MISCELLANEOUS) ×3 IMPLANT
CUTTER ECHEON FLEX ENDO 45 340 (ENDOMECHANICALS) ×3 IMPLANT
DERMABOND ADVANCED .7 DNX12 (GAUZE/BANDAGES/DRESSINGS) ×3 IMPLANT
DRAPE ARM DVNC X/XI (DISPOSABLE) ×12 IMPLANT
DRAPE COLUMN DVNC XI (DISPOSABLE) ×3 IMPLANT
DRAPE SURG IRRIG POUCH 19X23 (DRAPES) ×3 IMPLANT
DRIVER NDL LRG 8 DVNC XI (INSTRUMENTS) ×6 IMPLANT
DRIVER NDLE LRG 8 DVNC XI (INSTRUMENTS) IMPLANT
DRSG TEGADERM 4X4.75 (GAUZE/BANDAGES/DRESSINGS) ×3 IMPLANT
ELECT PENCIL ROCKER SW 15FT (MISCELLANEOUS) ×3 IMPLANT
ELECT REM PT RETURN 15FT ADLT (MISCELLANEOUS) ×3 IMPLANT
FORCEPS BPLR LNG DVNC XI (INSTRUMENTS) ×3 IMPLANT
FORCEPS PROGRASP DVNC XI (FORCEP) ×3 IMPLANT
GAUZE 4X4 16PLY ~~LOC~~+RFID DBL (SPONGE) IMPLANT
GAUZE SPONGE 2X2 8PLY STRL LF (GAUZE/BANDAGES/DRESSINGS) IMPLANT
GAUZE SPONGE 4X4 12PLY STRL (GAUZE/BANDAGES/DRESSINGS) ×3 IMPLANT
GLOVE BIO SURGEON STRL SZ 6.5 (GLOVE) ×3 IMPLANT
GLOVE SURG LX STRL 7.5 STRW (GLOVE) ×6 IMPLANT
GOWN STRL REUS W/ TWL XL LVL3 (GOWN DISPOSABLE) ×6 IMPLANT
GOWN STRL SURGICAL XL XLNG (GOWN DISPOSABLE) ×3 IMPLANT
HOLDER FOLEY CATH W/STRAP (MISCELLANEOUS) ×3 IMPLANT
IRRIG SUCT STRYKERFLOW 2 WTIP (MISCELLANEOUS) IMPLANT
IRRIGATION SUCT STRKRFLW 2 WTP (MISCELLANEOUS) ×3 IMPLANT
IV LACTATED RINGERS 1000ML (IV SOLUTION) ×3 IMPLANT
KIT TURNOVER KIT A (KITS) IMPLANT
PACK ROBOT UROLOGY CUSTOM (CUSTOM PROCEDURE TRAY) ×3 IMPLANT
PAD POSITIONING PINK XL (MISCELLANEOUS) ×3 IMPLANT
PLUG CATH AND CAP STRL 200 (CATHETERS) ×3 IMPLANT
RELOAD STAPLE 45 4.1 GRN THCK (STAPLE) ×3 IMPLANT
SCISSORS MNPLR CVD DVNC XI (INSTRUMENTS) ×3 IMPLANT
SEAL UNIV 5-12 XI (MISCELLANEOUS) ×12 IMPLANT
SET CYSTO W/LG BORE CLAMP LF (SET/KITS/TRAYS/PACK) IMPLANT
SET TUBE SMOKE EVAC HIGH FLOW (TUBING) ×3 IMPLANT
SOL ELECTROSURG ANTI STICK (MISCELLANEOUS) IMPLANT
SOL PREP POV-IOD 4OZ 10% (MISCELLANEOUS) ×3 IMPLANT
SOLUTION ELECTROSURG ANTI STCK (MISCELLANEOUS) ×3 IMPLANT
SPIKE FLUID TRANSFER (MISCELLANEOUS) ×3 IMPLANT
STAPLE RELOAD 45 GRN (STAPLE) IMPLANT
SUT ETHILON 3 0 PS 1 (SUTURE) ×3 IMPLANT
SUT MNCRL AB 4-0 PS2 18 (SUTURE) ×6 IMPLANT
SUT STRATA PDS 2-0 15 CT-2.5 (SUTURE) IMPLANT
SUT V-LOC BARB 180 2/0GR6 GS22 (SUTURE) IMPLANT
SUT VIC AB 0 CT1 27XBRD ANTBC (SUTURE) ×3 IMPLANT
SUT VICRYL 0 UR6 27IN ABS (SUTURE) ×6 IMPLANT
SUT VLOC BARB 180 ABS3/0GR12 (SUTURE) IMPLANT
SUTURE STRAT PDS 2-0 15 CT-2.5 (SUTURE) ×3 IMPLANT
SUTURE V-LC BRB 180 2/0GR6GS22 (SUTURE) ×3 IMPLANT
SUTURE VLOC BRB 180 ABS3/0GR12 (SUTURE) ×6 IMPLANT
WATER STERILE IRR 1000ML POUR (IV SOLUTION) ×3 IMPLANT

## 2023-04-30 NOTE — Transfer of Care (Signed)
 Immediate Anesthesia Transfer of Care Note  Patient: Craig Murphy  Procedure(s) Performed: ATTEMPTED PROSTATECTOMY, RADICAL, ROBOT-ASSISTED, LAPAROSCOPIC ATTEMPTED LYMPHADENECTOMY, PELVIS, ROBOT-ASSISTED (Bilateral)  Patient Location: PACU  Anesthesia Type:General  Level of Consciousness: sedated and patient cooperative  Airway & Oxygen Therapy: Patient Spontanous Breathing and Patient connected to face mask oxygen  Post-op Assessment: Report given to RN and Post -op Vital signs reviewed and stable  Post vital signs: Reviewed  Last Vitals:  Vitals Value Taken Time  BP 146/98 04/30/23 0901  Temp    Pulse 89 04/30/23 0914  Resp 14 04/30/23 0914  SpO2 97 % 04/30/23 0914  Vitals shown include unfiled device data.  Last Pain:  Vitals:   04/30/23 0901  TempSrc:   PainSc: 0-No pain         Complications:  Encounter Notable Events  Notable Event Outcome Phase Comment  Difficult to intubate - unexpected  In Recovery Filed from anesthesia note documentation.

## 2023-04-30 NOTE — OR Nursing (Signed)
 Davinci malfunction after patient was intubated prior to any incisions or procedure attempts. Call out to service technician, who tried to help trouble shoot over the phone. Unable to resolve problem. Decision made by Dr. Marlou Porch to abort surgery. Patient extubated and brought to PACU.

## 2023-04-30 NOTE — Anesthesia Procedure Notes (Signed)
 Procedure Name: Intubation Date/Time: 04/30/2023 7:55 AM  Performed by: Lovie Chol, CRNAPre-anesthesia Checklist: Patient identified, Emergency Drugs available, Suction available and Patient being monitored Patient Re-evaluated:Patient Re-evaluated prior to induction Oxygen Delivery Method: Circle System Utilized Preoxygenation: Pre-oxygenation with 100% oxygen Induction Type: IV induction Ventilation: Mask ventilation without difficulty, Two handed mask ventilation required and Oral airway inserted - appropriate to patient size Laryngoscope Size: Glidescope and 4 Grade View: Grade I Tube type: Oral Tube size: 7.5 mm Number of attempts: 1 Airway Equipment and Method: Oral airway, Video-laryngoscopy and Rigid stylet Placement Confirmation: ETT inserted through vocal cords under direct vision, positive ETCO2 and breath sounds checked- equal and bilateral Secured at: 22 cm Tube secured with: Tape Dental Injury: Teeth and Oropharynx as per pre-operative assessment  Difficulty Due To: Difficulty was unanticipated and Difficult Airway- due to anterior larynx Comments: Smooth IV induction. Easy mask with oral airway and 2-handed mask. DL x 1 Mil 3. 2a view by CRNA. Unable to pass ETT through cords. DL x 1 video-glide. Grade 1 view. Atraumatic oral intubation. +ETCO2. BBSE.

## 2023-04-30 NOTE — Interval H&P Note (Signed)
 History and Physical Interval Note:  04/30/2023 7:35 AM  Craig Murphy  has presented today for surgery, with the diagnosis of PROSTATE CANCER.  The various methods of treatment have been discussed with the patient and family. After consideration of risks, benefits and other options for treatment, the patient has consented to  Procedure(s): PROSTATECTOMY, RADICAL, ROBOT-ASSISTED, LAPAROSCOPIC (N/A) LYMPHADENECTOMY, PELVIS, ROBOT-ASSISTED (Bilateral) as a surgical intervention.  The patient's history has been reviewed, patient examined, no change in status, stable for surgery.  I have reviewed the patient's chart and labs.  Questions were answered to the patient's satisfaction.     Crist Fat

## 2023-04-30 NOTE — Op Note (Signed)
 The patient was taken to the operating room general anesthesia was induced.  Was prepped and draped in the routine sterile fashion.  A Foley catheter was placed.  At this point the robotic tower was powered on and there was not a recoverable fault.  After a series of steps recommended by the [employer] telephone support were unsuccessful in recovering the fault, the case was canceled.  The the patient's Foley catheter was removed, he was extubated, and he was taken to the recovery room in stable condition.  Disposition: The patient will be rescheduled soon as possible.

## 2023-04-30 NOTE — Anesthesia Preprocedure Evaluation (Signed)
 Anesthesia Evaluation  Patient identified by MRN, date of birth, ID band Patient awake    Reviewed: Allergy & Precautions, H&P , NPO status , Patient's Chart, lab work & pertinent test results  Airway Mallampati: II   Neck ROM: full    Dental   Pulmonary Current Smoker   breath sounds clear to auscultation       Cardiovascular negative cardio ROS  Rhythm:regular Rate:Normal     Neuro/Psych  Neuromuscular disease    GI/Hepatic   Endo/Other    Class 3 obesity  Renal/GU    Prostate CA    Musculoskeletal   Abdominal   Peds  Hematology   Anesthesia Other Findings   Reproductive/Obstetrics                             Anesthesia Physical Anesthesia Plan  ASA: 2  Anesthesia Plan: General   Post-op Pain Management:    Induction: Intravenous  PONV Risk Score and Plan: 1 and Ondansetron, Dexamethasone, Midazolam and Treatment may vary due to age or medical condition  Airway Management Planned: Oral ETT  Additional Equipment:   Intra-op Plan:   Post-operative Plan: Extubation in OR  Informed Consent: I have reviewed the patients History and Physical, chart, labs and discussed the procedure including the risks, benefits and alternatives for the proposed anesthesia with the patient or authorized representative who has indicated his/her understanding and acceptance.     Dental advisory given  Plan Discussed with: CRNA, Anesthesiologist and Surgeon  Anesthesia Plan Comments:        Anesthesia Quick Evaluation

## 2023-04-30 NOTE — Anesthesia Postprocedure Evaluation (Signed)
 Anesthesia Post Note  Patient: Craig Murphy  Procedure(s) Performed: ATTEMPTED PROSTATECTOMY, RADICAL, ROBOT-ASSISTED, LAPAROSCOPIC ATTEMPTED LYMPHADENECTOMY, PELVIS, ROBOT-ASSISTED (Bilateral)     Patient location during evaluation: PACU Anesthesia Type: General Level of consciousness: awake and alert Pain management: pain level controlled Vital Signs Assessment: post-procedure vital signs reviewed and stable Respiratory status: spontaneous breathing, nonlabored ventilation, respiratory function stable and patient connected to nasal cannula oxygen Cardiovascular status: blood pressure returned to baseline and stable Postop Assessment: no apparent nausea or vomiting Anesthetic complications: yes   Encounter Notable Events  Notable Event Outcome Phase Comment  Difficult to intubate - unexpected  In Recovery Filed from anesthesia note documentation.    Last Vitals:  Vitals:   04/30/23 0931 04/30/23 0945  BP: 119/81 (!) 118/97  Pulse: 72 79  Resp: 13 15  Temp:  36.4 C  SpO2: 99% 99%    Last Pain:  Vitals:   04/30/23 0945  TempSrc:   PainSc: 0-No pain                 Gaige Fussner S

## 2023-04-30 NOTE — H&P (Signed)
 CC/HPI: cc: Prostate cancer   Dx: Gleason 3+3 = 6 on 02/28/2022   PSA: 6.6   Prostate biopsy, 02/28/2022: Gleason 3+3 = 6-right lateral base/medial base, right lateral mid and right medial apex  Biopsy: 9/24: 3+4 = 7 in 2 cores (right side) and Gleason 3+3 = 6 in 2 cores.   MRI 9/24: No evidence of high-grade cancer. Prostate volume 22 g, patient with a large median lobe   History of prostatitis with improvement in his voiding symptoms when treated with tamsulosin, meloxicam, and physical therapy. Also has some mild erectile dysfunction, but seems to be improving.     ALLERGIES: No Allergies    MEDICATIONS: Amlodipine Besylate 5 mg tablet  Aspirin 81 MG TABS Oral  Gabapentin 100 mg capsule  Multivitamin     GU PSH: Prostate Needle Biopsy - 10/27/2022, 03/31/2022       PSH Notes: Encounter for contraceptive planning, Open Lung Biopsy   NON-GU PSH: Surgical Pathology, Gross And Microscopic Examination For Prostate Needle - 10/27/2022, 03/31/2022     GU PMH: Prostate Cancer - 10/27/2022, - 04/09/2022 Elevated PSA - 03/31/2022, - 03/03/2022, - 09/01/2021, - 07/21/2021, - 2022, - 2021, - 2021 Chronic prostatitis - 09/01/2021 Acute prostatitis - 08/25/2021, - 2022, - 2022, - 2021, - 2021      PMH Notes:  1898-02-09 00:00:00 - Note: Normal Routine History And Physical Adult   NON-GU PMH: Muscle weakness (generalized) - 09/01/2021, - 08/25/2021, - 2022, - 2022 Other muscle spasm - 08/25/2021, - 2022, - 2022 Other specified disorders of muscle - 08/25/2021, - 2022, - 2022    FAMILY HISTORY: Acute Myocardial Infarction - Father Death In The Family Father - Father Diabetes - Runs in Family Family Health Status Number - Runs In Family   SOCIAL HISTORY: Marital Status: Married Preferred Language: English Current Smoking Status: Patient has never smoked.   Tobacco Use Assessment Completed: Used Tobacco in last 30 days? Drinks 4 drinks per day.  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      Notes: Caffeine Use, Alcohol Use, Marital History - Currently Married, Occupation:, Tobacco Use   REVIEW OF SYSTEMS:    GU Review Male:   Patient denies frequent urination, hard to postpone urination, burning/ pain with urination, get up at night to urinate, leakage of urine, stream starts and stops, trouble starting your stream, have to strain to urinate , erection problems, and penile pain.  Gastrointestinal (Upper):   Patient denies nausea, vomiting, and indigestion/ heartburn.  Gastrointestinal (Lower):   Patient denies diarrhea and constipation.  Constitutional:   Patient denies fever, night sweats, weight loss, and fatigue.  Skin:   Patient denies itching and skin rash/ lesion.  Eyes:   Patient denies blurred vision and double vision.  Ears/ Nose/ Throat:   Patient denies sore throat and sinus problems.  Hematologic/Lymphatic:   Patient denies swollen glands and easy bruising.  Cardiovascular:   Patient denies leg swelling and chest pains.  Respiratory:   Patient denies cough and shortness of breath.  Endocrine:   Patient denies excessive thirst.  Musculoskeletal:   Patient denies back pain and joint pain.  Neurological:   Patient denies headaches and dizziness.  Psychologic:   Patient denies depression and anxiety.   VITAL SIGNS: None   MULTI-SYSTEM PHYSICAL EXAMINATION:    Constitutional: Well-nourished. No physical deformities. Normally developed. Good grooming.  Respiratory: Normal breath sounds. No labored breathing, no use of accessory muscles.   Cardiovascular: Regular rate and rhythm. No murmur, no  gallop. Normal temperature, normal extremity pulses, no swelling, no varicosities.      Complexity of Data:  Source Of History:  Patient  Lab Test Review:   PSA  Records Review:   Pathology Reports, Previous Doctor Records, Previous Patient Records  Urine Test Review:   Urinalysis  Urodynamics Review:   Review Bladder Scan   10/09/22 02/24/22 08/25/21 07/21/21 09/25/19   PSA  Total PSA 5.68 ng/mL 6.59 ng/mL 4.69 ng/mL 5.44 ng/mL 5.86 ng/mL  Free PSA     0.45 ng/mL  % Free PSA     8 % PSA    PROCEDURES:          Visit Complexity - G2211    ASSESSMENT:      ICD-10 Details  1 GU:   Prostate Cancer - C61    PLAN:           Schedule Return Visit/Planned Activity: ASAP - PT Referral             Note: Preop RALP          Document Letter(s):  Created for Patient: Clinical Summary         Notes:   We discussed prostatectomy and specifically robotic prostatectomy with bilateral pelvic lymphadenectomy being the technique that I most commonly perform. I showed the patient on their abdomen the approximately 6 small incision (trocar) sites as well as presumed extraction sites with robotic approach as well as possible open incision sites should open conversion be necessary. We discussed peri-operative risks including bleeding, infection, deep vein thrombosis, pulmonary embolism, compartment syndrome, nuropathy / neuropraxia, heart attack, stroke, death, as well as long-term risks such as non-cure / need for additional therapy. We specifically addressed that the procedure would compromise urinary control leading to stress incontinence which typically resolves with time and pelvic rehabilitation (Kegel's, etc..), but can sometimes be permanent and require additional therapy including surgery. We also specifically addressed sexual sequellae including significant erectile dysfunction which typically partially resolves with time but can also be permanent and require additional therapy including surgery.   We discussed the typical hospital course including usual 1-2 night hospitalization, discharge with foley catheter in place usually for 1-2 weeks before voiding trial as well as usually 2 week recovery until able to perform most non-strenuous activity and 6 weeks until able to return to most jobs and more strenuous activity such as exercise.   Will plan to perform  bilateral pelvic node dissection as well as bilateral nerve sparing prostatectomy.   Will get this scheduled for him quickly, would like him to see the pelvic floor physical therapist preoperatively.

## 2023-05-01 ENCOUNTER — Encounter (HOSPITAL_COMMUNITY): Payer: Self-pay | Admitting: Urology

## 2023-05-03 ENCOUNTER — Other Ambulatory Visit: Payer: Self-pay | Admitting: Urology

## 2023-07-01 NOTE — Progress Notes (Addendum)
 COVID Vaccine Completed:  Date of COVID positive in last 90 days:  PCP - Dr. Burdette Carolin on [address]. Patient cannot remember exactly who he saw. Cardiologist - n/a Pulmonologist- Josephus Nida, NP- no longer seeing   Chest x-ray -  EKG - 04/23/23 Epic Stress Test - 18 years ago per pt ECHO - 18 years ago per pt Cardiac Cath - n/a Pacemaker/ICD device last checked: n/a Spinal Cord Stimulator:n/a  Bowel Prep - clears day before, fleet enema  Sleep Study - n/a CPAP -   Fasting Blood Sugar - n/a Checks Blood Sugar _____ times a day  Last dose of GLP1 agonist-  N/A GLP1 instructions:  Hold 7 days before surgery    Last dose of SGLT-2 inhibitors-  N/A SGLT-2 instructions:  Hold 3 days before surgery    Blood Thinner Instructions:  Last dose: n/a  Time: Aspirin Instructions: Last Dose:  Activity level: Can go up a flight of stairs and perform activities of daily living without stopping and without symptoms of chest pain or shortness of breath.   Anesthesia review: difficult intubation, palpitations, sarcoidosis of lung? One doctor said yes another said no  Patient denies shortness of breath, fever, cough and chest pain at PAT appointment  Patient verbalized understanding of instructions that were given to them at the PAT appointment. Patient was also instructed that they will need to review over the PAT instructions again at home before surgery.

## 2023-07-01 NOTE — Patient Instructions (Addendum)
 SURGICAL WAITING ROOM VISITATION  Patients having surgery or a procedure may have no more than 2 support people in the waiting area - these visitors may rotate.    Children under the age of 37 must have an adult with them who is not the patient.  Due to an increase in RSV and influenza rates and associated hospitalizations, children ages 80 and under may not visit patients in [facility] hospitals.  Visitors with respiratory illnesses are discouraged from visiting and should remain at home.  If the patient needs to stay at the hospital during part of their recovery, the visitor guidelines for inpatient rooms apply. Pre-op nurse will coordinate an appropriate time for 1 support person to accompany patient in pre-op.  This support person may not rotate.    Please refer to the [facility] website for the visitor guidelines for Inpatients (after your surgery is over and you are in a regular room).    Your procedure is scheduled on: 07/08/23   Report to [facility] Main Entrance    Report to admitting at 5:15 AM   Call this number if you have problems the morning of surgery [phone]   Follow a clear liquid diet the day before surgery.  Water Non-Citrus Juices (without pulp, NO RED-Apple, White grape, White cranberry) Black Coffee (NO MILK/CREAM OR CREAMERS, sugar ok)  Clear Tea (NO MILK/CREAM OR CREAMERS, sugar ok) regular and decaf                             Plain Jell-O (NO RED)                                           Fruit ices (not with fruit pulp, NO RED)                                     Popsicles (NO RED)                                                               Sports drinks like Gatorade (NO RED)              Nothing to drink after midnight          If you have questions, please contact your surgeon's office.   FOLLOW BOWEL PREP AND ANY ADDITIONAL PRE OP INSTRUCTIONS YOU RECEIVED FROM YOUR SURGEON'S OFFICE!!!     Oral Hygiene is also important to  reduce your risk of infection.                                    Remember - BRUSH YOUR TEETH THE MORNING OF SURGERY WITH YOUR REGULAR TOOTHPASTE  DENTURES WILL BE REMOVED PRIOR TO SURGERY PLEASE DO NOT APPLY "Poly grip" OR ADHESIVES!!!   Stop all vitamins and herbal supplements 7 days before surgery.   Take these medicines the morning of surgery with A SIP OF WATER: Tylenol  You may not have any metal on your body including jewelry, and body piercing             Do not wear lotions, powders, cologne, or deodorant              Men may shave face and neck.   Do not bring valuables to the hospital. [place] IS NOT             RESPONSIBLE   FOR VALUABLES.   Contacts, glasses, dentures or bridgework may not be worn into surgery.   Bring small overnight bag day of surgery.   DO NOT BRING YOUR HOME MEDICATIONS TO THE HOSPITAL. PHARMACY WILL DISPENSE MEDICATIONS LISTED ON YOUR MEDICATION LIST TO YOU DURING YOUR ADMISSION IN THE HOSPITAL!              Please read over the following fact sheets you were given: IF YOU HAVE QUESTIONS ABOUT YOUR PRE-OP INSTRUCTIONS PLEASE CALL [phone] Party    If you received a COVID test during your pre-op visit  it is requested that you wear a mask when out in public, stay away from anyone that may not be feeling well and notify your surgeon if you develop symptoms. If you test positive for Covid or have been in contact with anyone that has tested positive in the last 10 days please notify you surgeon.    [place] - Preparing for Surgery Before surgery, you can play an important role.  Because skin is not sterile, your skin needs to be as free of germs as possible.  You can reduce the number of germs on your skin by washing with CHG (chlorahexidine gluconate) soap before surgery.  CHG is an antiseptic cleaner which kills germs and bonds with the skin to continue killing germs even after washing. Please DO NOT use if you  have an allergy to CHG or antibacterial soaps.  If your skin becomes reddened/irritated stop using the CHG and inform your nurse when you arrive at Short Stay. Do not shave (including legs and underarms) for at least 48 hours prior to the first CHG shower.  You may shave your face/neck.  Please follow these instructions carefully:  1.  Shower with CHG Soap the night before surgery and the  morning of surgery.  2.  If you choose to wash your hair, wash your hair first as usual with your normal  shampoo.  3.  After you shampoo, rinse your hair and body thoroughly to remove the shampoo.                             4.  Use CHG as you would any other liquid soap.  You can apply chg directly to the skin and wash.  Gently with a scrungie or clean washcloth.  5.  Apply the CHG Soap to your body ONLY FROM THE NECK DOWN.   Do   not use on face/ open                           Wound or open sores. Avoid contact with eyes, ears mouth and   genitals (private parts).                       Wash face,  Genitals (private parts) with your normal soap.  6.  Wash thoroughly, paying special attention to the area where your    surgery  will be performed.  7.  Thoroughly rinse your body with warm water from the neck down.  8.  DO NOT shower/wash with your normal soap after using and rinsing off the CHG Soap.                9.  Pat yourself dry with a clean towel.            10.  Wear clean pajamas.            11.  Place clean sheets on your bed the night of your first shower and do not  sleep with pets. Day of Surgery : Do not apply any lotions/deodorants the morning of surgery.  Please wear clean clothes to the hospital/surgery center.  FAILURE TO FOLLOW THESE INSTRUCTIONS MAY RESULT IN THE CANCELLATION OF YOUR SURGERY  PATIENT SIGNATURE_________________________________  NURSE SIGNATURE__________________________________  ________________________________________________________________________ WHAT IS  A BLOOD TRANSFUSION? Blood Transfusion Information  A transfusion is the replacement of blood or some of its parts. Blood is made up of multiple cells which provide different functions. Red blood cells carry oxygen and are used for blood loss replacement. White blood cells fight against infection. Platelets control bleeding. Plasma helps clot blood. Other blood products are available for specialized needs, such as hemophilia or other clotting disorders. BEFORE THE TRANSFUSION  Who gives blood for transfusions?  Healthy volunteers who are fully evaluated to make sure their blood is safe. This is blood bank blood. Transfusion therapy is the safest it has ever been in the practice of medicine. Before blood is taken from a donor, a complete history is taken to make sure that person has no history of diseases nor engages in risky social behavior (examples are intravenous drug use or sexual activity with multiple partners). The donor's travel history is screened to minimize risk of transmitting infections, such as malaria. The donated blood is tested for signs of infectious diseases, such as HIV and hepatitis. The blood is then tested to be sure it is compatible with you in order to minimize the chance of a transfusion reaction. If you or a relative donates blood, this is often done in anticipation of surgery and is not appropriate for emergency situations. It takes many days to process the donated blood. RISKS AND COMPLICATIONS Although transfusion therapy is very safe and saves many lives, the main dangers of transfusion include:  Getting an infectious disease. Developing a transfusion reaction. This is an allergic reaction to something in the blood you were given. Every precaution is taken to prevent this. The decision to have a blood transfusion has been considered carefully by your caregiver before blood is given. Blood is not given unless the benefits outweigh the risks. AFTER THE TRANSFUSION Right  after receiving a blood transfusion, you will usually feel much better and more energetic. This is especially true if your red blood cells have gotten low (anemic). The transfusion raises the level of the red blood cells which carry oxygen, and this usually causes an energy increase. The nurse administering the transfusion will monitor you carefully for complications. HOME CARE INSTRUCTIONS  No special instructions are needed after a transfusion. You may find your energy is better. Speak with your caregiver about any limitations on activity for underlying diseases you may have. SEEK MEDICAL CARE IF:  Your condition is not improving after your transfusion. You develop redness or irritation at the intravenous (  IV) site. SEEK IMMEDIATE MEDICAL CARE IF:  Any of the following symptoms occur over the next 12 hours: Shaking chills. You have a temperature by mouth above 102 F (38.9 C), not controlled by medicine. Chest, back, or muscle pain. People around you feel you are not acting correctly or are confused. Shortness of breath or difficulty breathing. Dizziness and fainting. You get a rash or develop hives. You have a decrease in urine output. Your urine turns a dark color or changes to pink, red, or brown. Any of the following symptoms occur over the next 10 days: You have a temperature by mouth above 102 F (38.9 C), not controlled by medicine. Shortness of breath. Weakness after normal activity. The white part of the eye turns yellow (jaundice). You have a decrease in the amount of urine or are urinating less often. Your urine turns a dark color or changes to pink, red, or brown. Document Released: 01/24/2000 Document Revised: 04/20/2011 Document Reviewed: 09/12/2007 [facility] Patient Information 2014 [place], [place].  _______________________________________________________________________

## 2023-07-02 ENCOUNTER — Encounter (HOSPITAL_COMMUNITY)
Admission: RE | Admit: 2023-07-02 | Discharge: 2023-07-02 | Disposition: A | Discharge status: Home / Self Care | Source: Ambulatory Visit | Attending: Urology | Admitting: Urology

## 2023-07-02 ENCOUNTER — Encounter (HOSPITAL_COMMUNITY): Payer: Self-pay

## 2023-07-02 ENCOUNTER — Other Ambulatory Visit: Payer: Self-pay

## 2023-07-02 DIAGNOSIS — Z01812 Encounter for preprocedural laboratory examination: Secondary | ICD-10-CM | POA: Diagnosis not present

## 2023-07-02 DIAGNOSIS — I10 Essential (primary) hypertension: Secondary | ICD-10-CM | POA: Diagnosis not present

## 2023-07-02 DIAGNOSIS — C61 Malignant neoplasm of prostate: Secondary | ICD-10-CM | POA: Diagnosis not present

## 2023-07-02 HISTORY — DX: Essential (primary) hypertension: I10

## 2023-07-02 LAB — CBC
HCT: 44.7 % (ref 39.0–52.0)
Hemoglobin: 14.2 g/dL (ref 13.0–17.0)
MCH: 30.1 pg (ref 26.0–34.0)
MCHC: 31.8 g/dL (ref 30.0–36.0)
MCV: 94.7 fL (ref 80.0–100.0)
Platelets: 276 10*3/uL (ref 150–400)
RBC: 4.72 MIL/uL (ref 4.22–5.81)
RDW: 12.7 % (ref 11.5–15.5)
WBC: 4.6 10*3/uL (ref 4.0–10.5)
nRBC: 0 % (ref 0.0–0.2)

## 2023-07-02 LAB — BASIC METABOLIC PANEL WITH GFR
Anion gap: 10 (ref 5–15)
BUN: 15 mg/dL (ref 6–20)
CO2: 21 mmol/L — ABNORMAL LOW (ref 22–32)
Calcium: 9.5 mg/dL (ref 8.9–10.3)
Chloride: 103 mmol/L (ref 98–111)
Creatinine, Ser: 0.84 mg/dL (ref 0.61–1.24)
GFR, Estimated: >60 mL/min (ref >60)
Glucose, Bld: 120 mg/dL — ABNORMAL HIGH (ref 70–99)
Potassium: 4.5 mmol/L (ref 3.5–5.1)
Sodium: 134 mmol/L — ABNORMAL LOW (ref 135–145)

## 2023-07-06 NOTE — Progress Notes (Signed)
 Case: [id number] Date/Time: 07/08/23 0715   Procedures:      PROSTATECTOMY, RADICAL, ROBOT-ASSISTED, LAPAROSCOPIC     LYMPHADENECTOMY, PELVIS, ROBOT-ASSISTED (Bilateral)   Anesthesia type: General   Pre-op diagnosis: PROSTATE CANCER   Location: [facility] ROOM 03 / [facility]   Surgeons: Craig Banker, MD       DISCUSSION: Craig Murphy is a 48 yo male who presents to PAT prior to surgery above. PMH of HTN, sarcoidosis of lung, palpitations, prostate cancer   Case was scheduled for 3/21 however canceled due to issue with robot.  Prior anesthesia complications include difficult intubation noted during 04/30/23 surgery. Glidescope was used due to anterior larynx. Per CRNA notes:  "Smooth IV induction. Easy mask with oral airway and 2-handed mask. DL x 1 Mil 3. 2a view by CRNA. Unable to pass ETT through cords. DL x 1 video-glide. Grade 1 view. Atraumatic oral intubation. +ETCO2. BBSE."  Patient previously followed with Pulmonology for sarcoidosis which was diagnosed by bronch in 2010. Last seen in 2020. He had labs and CT Chest which did not show any active sarcoidosis. He was recommended to have PFTs but never had them done. Pt denies symptoms at PAT visit.  VS: BP (!) 131/94   Pulse 72   Temp 37 C (Oral)   Resp 16   Ht 5\' 11"  (1.803 Murphy)   Wt 113.4 kg   SpO2 100%   BMI 34.87 kg/Murphy   PROVIDERS: Patient, No Pcp Per   LABS: Labs reviewed: Acceptable for surgery. (all labs ordered are listed, but only abnormal results are displayed)  Labs Reviewed  BASIC METABOLIC PANEL WITH GFR - Abnormal; Notable for the following components:      Result Value   Sodium 134 (*)    CO2 21 (*)    Glucose, Bld 120 (*)    All other components within normal limits  CBC  TYPE AND SCREEN     IMAGES: CT chest 11/03/2018:  IMPRESSION: 1. No active pulmonary disease. Specifically, no pulmonary parenchymal findings of sarcoidosis. 2. No thoracic lymphadenopathy. 3. Mild diffuse hepatic  steatosis.  EKG 04/23/23:  Sinus rhythm with Premature atrial complexes Otherwise normal ECG  CV:  Past Medical History:  Diagnosis Date   Cancer (HCC)    prostate   Elevated PSA    Foot pain    Hypertension    Palpitations    Peripheral neuropathy    Plantar neuroma    of feet   Sarcoidosis of lung (HCC)    no issues he had two diffrent opininos with two diffrent doctors. One said no one said yes    Past Surgical History:  Procedure Laterality Date   LUNG BIOPSY     ROBOT ASSISTED LAPAROSCOPIC RADICAL PROSTATECTOMY N/A 04/30/2023   Procedure: ATTEMPTED PROSTATECTOMY, RADICAL, ROBOT-ASSISTED, LAPAROSCOPIC;  Surgeon: Craig Banker, MD;  Location: [facility];  Service: Urology;  Laterality: N/A;    MEDICATIONS:  acetaminophen (TYLENOL) 500 MG tablet   lisinopril (ZESTRIL) 20 MG tablet   Multiple Vitamin (MULTIVITAMIN) tablet    triamcinolone  acetonide (KENALOG ) 10 MG/ML injection 10 mg   Craig Murphy Craig Lazenby, PA-C [facility] Short Stay/Anesthesiology [facility] Phone [phone] 07/06/2023 9:03 AM

## 2023-07-07 NOTE — Anesthesia Preprocedure Evaluation (Signed)
 Anesthesia Evaluation  Patient identified by MRN, date of birth, ID band Patient awake    Reviewed: Allergy & Precautions, NPO status , Patient's Chart, lab work & pertinent test results  Airway Mallampati: II  TM Distance: >3 FB Neck ROM: Full    Dental no notable dental hx. (+) Teeth Intact, Dental Advisory Given   Pulmonary Current Smoker and Patient abstained from smoking., former smoker Hx of sarcoidosis . resolved   Pulmonary exam normal breath sounds clear to auscultation       Cardiovascular hypertension, Pt. on medications Normal cardiovascular exam Rhythm:Regular Rate:Normal     Neuro/Psych  Neuromuscular disease  negative psych ROS   GI/Hepatic negative GI ROS, Neg liver ROS,,,  Endo/Other  negative endocrine ROS    Renal/GU Lab Results      Component                Value               Date                             K                        4.5                 07/02/2023                CREATININE               0.84                07/02/2023                       GLUCOSE                  120 (H)             07/02/2023              Prostate CA elevated PSA    Musculoskeletal negative musculoskeletal ROS (+)    Abdominal   Peds  Hematology Lab Results      Component                Value               Date                      WBC                      4.6                 07/02/2023                HGB                      14.2                07/02/2023                HCT                      44.7                07/02/2023  MCV                      94.7                07/02/2023                PLT                      276                 07/02/2023              Anesthesia Other Findings All: Gabapentin , Mobic  Reproductive/Obstetrics                             Anesthesia Physical Anesthesia Plan  ASA: 3  Anesthesia Plan: General   Post-op Pain Management:  Ketamine  IV* and Tylenol PO (pre-op)*   Induction: Intravenous  PONV Risk Score and Plan: Treatment may vary due to age or medical condition  Airway Management Planned: Oral ETT and Video Laryngoscope Planned  Additional Equipment: None  Intra-op Plan:   Post-operative Plan: Extubation in OR  Informed Consent: I have reviewed the patients History and Physical, chart, labs and discussed the procedure including the risks, benefits and alternatives for the proposed anesthesia with the patient or authorized representative who has indicated his/her understanding and acceptance.     Dental advisory given  Plan Discussed with: CRNA and Surgeon  Anesthesia Plan Comments:         Anesthesia Quick Evaluation

## 2023-07-08 ENCOUNTER — Ambulatory Visit (HOSPITAL_COMMUNITY): Payer: Self-pay | Admitting: Medical

## 2023-07-08 ENCOUNTER — Ambulatory Visit (HOSPITAL_COMMUNITY): Payer: Self-pay | Admitting: Anesthesiology

## 2023-07-08 ENCOUNTER — Encounter (HOSPITAL_COMMUNITY)
Admission: RE | Disposition: A | Discharge status: Home / Self Care | Payer: Self-pay | Source: Home / Self Care | Attending: Urology

## 2023-07-08 ENCOUNTER — Other Ambulatory Visit: Payer: Self-pay

## 2023-07-08 ENCOUNTER — Observation Stay (HOSPITAL_COMMUNITY)
Admission: RE | Admit: 2023-07-08 | Discharge: 2023-07-09 | Disposition: A | Discharge status: Home / Self Care | Attending: Urology | Admitting: Urology

## 2023-07-08 ENCOUNTER — Encounter (HOSPITAL_COMMUNITY): Payer: Self-pay | Admitting: Urology

## 2023-07-08 DIAGNOSIS — I10 Essential (primary) hypertension: Secondary | ICD-10-CM | POA: Insufficient documentation

## 2023-07-08 DIAGNOSIS — Z79899 Other long term (current) drug therapy: Secondary | ICD-10-CM | POA: Insufficient documentation

## 2023-07-08 DIAGNOSIS — Z7982 Long term (current) use of aspirin: Secondary | ICD-10-CM | POA: Insufficient documentation

## 2023-07-08 DIAGNOSIS — C61 Malignant neoplasm of prostate: Secondary | ICD-10-CM | POA: Diagnosis not present

## 2023-07-08 HISTORY — PX: ROBOT ASSISTED LAPAROSCOPIC RADICAL PROSTATECTOMY: SHX5141

## 2023-07-08 LAB — TYPE AND SCREEN
ABO/RH(D): O POS
Antibody Screen: NEGATIVE

## 2023-07-08 LAB — HEMOGLOBIN AND HEMATOCRIT, BLOOD
HCT: 39.9 % (ref 39.0–52.0)
Hemoglobin: 13 g/dL (ref 13.0–17.0)

## 2023-07-08 SURGERY — PROSTATECTOMY, RADICAL, ROBOT-ASSISTED, LAPAROSCOPIC
Anesthesia: General

## 2023-07-08 MED ORDER — DOCUSATE SODIUM 100 MG PO CAPS: 100.0000 mg | ORAL_CAPSULE | Freq: Two times a day (BID) | ORAL | Status: AC

## 2023-07-08 MED ORDER — SUCCINYLCHOLINE CHLORIDE 200 MG/10ML IV SOSY
PREFILLED_SYRINGE | INTRAVENOUS | Status: AC
  Filled 2023-07-08: qty 10

## 2023-07-08 MED ORDER — PHENYLEPHRINE 80 MCG/ML (10ML) SYRINGE FOR IV PUSH (FOR BLOOD PRESSURE SUPPORT)
PREFILLED_SYRINGE | INTRAVENOUS | Status: AC
  Filled 2023-07-08: qty 10

## 2023-07-08 MED ORDER — LIDOCAINE HCL (PF) 2 % IJ SOLN
INTRAMUSCULAR | Status: AC
  Filled 2023-07-08: qty 5

## 2023-07-08 MED ORDER — ROCURONIUM BROMIDE 10 MG/ML (PF) SYRINGE
PREFILLED_SYRINGE | INTRAVENOUS | Status: AC
  Filled 2023-07-08: qty 10

## 2023-07-08 MED ORDER — ACETAMINOPHEN 10 MG/ML IV SOLN
1000.0000 mg | Freq: Four times a day (QID) | INTRAVENOUS | Status: DC
  Administered 2023-07-08 – 2023-07-09 (×3): 1000 mg via INTRAVENOUS
  Filled 2023-07-08 (×3): qty 100

## 2023-07-08 MED ORDER — ROCURONIUM BROMIDE 10 MG/ML (PF) SYRINGE
PREFILLED_SYRINGE | INTRAVENOUS | Status: DC | PRN
  Administered 2023-07-08: 50 mg via INTRAVENOUS
  Administered 2023-07-08: 70 mg via INTRAVENOUS
  Administered 2023-07-08: 30 mg via INTRAVENOUS

## 2023-07-08 MED ORDER — DEXAMETHASONE SODIUM PHOSPHATE 10 MG/ML IJ SOLN
INTRAMUSCULAR | Status: AC
  Filled 2023-07-08: qty 1

## 2023-07-08 MED ORDER — ONDANSETRON HCL 4 MG/2ML IJ SOLN: 4.0000 mg | INTRAMUSCULAR | Status: DC | PRN

## 2023-07-08 MED ORDER — PHENYLEPHRINE 80 MCG/ML (10ML) SYRINGE FOR IV PUSH (FOR BLOOD PRESSURE SUPPORT)
PREFILLED_SYRINGE | INTRAVENOUS | Status: DC | PRN
Start: 2023-07-08 — End: 2023-07-08
  Administered 2023-07-08: 160 ug via INTRAVENOUS

## 2023-07-08 MED ORDER — OXYCODONE HCL 5 MG/5ML PO SOLN: 5.0000 mg | Freq: Once | ORAL | Status: DC | PRN

## 2023-07-08 MED ORDER — BUPIVACAINE LIPOSOME 1.3 % IJ SUSP
INTRAMUSCULAR | Status: DC | PRN
  Administered 2023-07-08: 20 mL

## 2023-07-08 MED ORDER — SODIUM CHLORIDE 0.45 % IV SOLN: INTRAVENOUS | Status: DC

## 2023-07-08 MED ORDER — ACETAMINOPHEN 10 MG/ML IV SOLN: 1000.0000 mg | Freq: Once | INTRAVENOUS | Status: DC | PRN

## 2023-07-08 MED ORDER — FENTANYL CITRATE (PF) 250 MCG/5ML IJ SOLN
INTRAMUSCULAR | Status: DC | PRN
  Administered 2023-07-08: 50 ug via INTRAVENOUS
  Administered 2023-07-08 (×2): 100 ug via INTRAVENOUS

## 2023-07-08 MED ORDER — HYDROMORPHONE HCL 1 MG/ML IJ SOLN
INTRAMUSCULAR | Status: DC | PRN
  Administered 2023-07-08: 1 mg via INTRAVENOUS

## 2023-07-08 MED ORDER — FLEET ENEMA RE ENEM: 1.0000 | ENEMA | Freq: Once | RECTAL | Status: DC

## 2023-07-08 MED ORDER — BUPIVACAINE LIPOSOME 1.3 % IJ SUSP
INTRAMUSCULAR | Status: AC
  Filled 2023-07-08: qty 20

## 2023-07-08 MED ORDER — TRAMADOL HCL 50 MG PO TABS: 50.0000 mg | ORAL_TABLET | Freq: Four times a day (QID) | ORAL | 0 refills | Status: AC | PRN

## 2023-07-08 MED ORDER — STERILE WATER FOR IRRIGATION IR SOLN
Status: DC | PRN
  Administered 2023-07-08: 1000 mL

## 2023-07-08 MED ORDER — HYDROMORPHONE HCL 1 MG/ML IJ SOLN
0.5000 mg | INTRAMUSCULAR | Status: DC | PRN
  Administered 2023-07-08: 1 mg via INTRAVENOUS
  Administered 2023-07-08: 0.5 mg via INTRAVENOUS
  Administered 2023-07-09: 1 mg via INTRAVENOUS
  Filled 2023-07-08 (×3): qty 1

## 2023-07-08 MED ORDER — BUPIVACAINE-EPINEPHRINE 0.5% -1:200000 IJ SOLN
INTRAMUSCULAR | Status: DC | PRN
Start: 2023-07-08 — End: 2023-07-08
  Administered 2023-07-08: 50 mL

## 2023-07-08 MED ORDER — OXYCODONE HCL 5 MG PO TABS: 5.0000 mg | ORAL_TABLET | Freq: Once | ORAL | Status: DC | PRN

## 2023-07-08 MED ORDER — MIDAZOLAM HCL 2 MG/2ML IJ SOLN
INTRAMUSCULAR | Status: AC
  Filled 2023-07-08: qty 2

## 2023-07-08 MED ORDER — CEFAZOLIN SODIUM-DEXTROSE 2-4 GM/100ML-% IV SOLN
2.0000 g | INTRAVENOUS | Status: AC
  Administered 2023-07-08: 2 g via INTRAVENOUS
  Filled 2023-07-08: qty 100

## 2023-07-08 MED ORDER — SODIUM CHLORIDE 0.9 % IV BOLUS
1000.0000 mL | Freq: Once | INTRAVENOUS | Status: AC
  Administered 2023-07-08: 1000 mL via INTRAVENOUS

## 2023-07-08 MED ORDER — LIDOCAINE 2% (20 MG/ML) 5 ML SYRINGE
INTRAMUSCULAR | Status: DC | PRN
  Administered 2023-07-08: 100 mg via INTRAVENOUS

## 2023-07-08 MED ORDER — SUGAMMADEX SODIUM 200 MG/2ML IV SOLN
INTRAVENOUS | Status: DC | PRN
  Administered 2023-07-08: 300 mg via INTRAVENOUS

## 2023-07-08 MED ORDER — ACETAMINOPHEN 10 MG/ML IV SOLN
1000.0000 mg | Freq: Once | INTRAVENOUS | Status: DC | PRN
Start: 2023-07-08 — End: 2023-07-08

## 2023-07-08 MED ORDER — ORAL CARE MOUTH RINSE: 15.0000 mL | Freq: Once | OROMUCOSAL | Status: AC

## 2023-07-08 MED ORDER — HYDROMORPHONE HCL 1 MG/ML IJ SOLN
0.2500 mg | INTRAMUSCULAR | Status: DC | PRN
  Administered 2023-07-08: 0.5 mg via INTRAVENOUS

## 2023-07-08 MED ORDER — DROPERIDOL 2.5 MG/ML IJ SOLN: 0.6250 mg | Freq: Once | INTRAMUSCULAR | Status: DC | PRN

## 2023-07-08 MED ORDER — DOCUSATE SODIUM 100 MG PO CAPS
100.0000 mg | ORAL_CAPSULE | Freq: Two times a day (BID) | ORAL | Status: DC
  Administered 2023-07-08 – 2023-07-09 (×2): 100 mg via ORAL
  Filled 2023-07-08 (×2): qty 1

## 2023-07-08 MED ORDER — SULFAMETHOXAZOLE-TRIMETHOPRIM 800-160 MG PO TABS: 1.0000 | ORAL_TABLET | Freq: Two times a day (BID) | ORAL | 0 refills | Status: AC

## 2023-07-08 MED ORDER — FENTANYL CITRATE (PF) 250 MCG/5ML IJ SOLN
INTRAMUSCULAR | Status: AC
  Filled 2023-07-08: qty 5

## 2023-07-08 MED ORDER — HYOSCYAMINE SULFATE 0.125 MG SL SUBL
0.1250 mg | SUBLINGUAL_TABLET | SUBLINGUAL | Status: DC | PRN
  Administered 2023-07-08 (×2): 0.125 mg via SUBLINGUAL
  Filled 2023-07-08 (×2): qty 1

## 2023-07-08 MED ORDER — CHLORHEXIDINE GLUCONATE 0.12 % MT SOLN
15.0000 mL | Freq: Once | OROMUCOSAL | Status: AC
  Administered 2023-07-08: 15 mL via OROMUCOSAL

## 2023-07-08 MED ORDER — LISINOPRIL 20 MG PO TABS
20.0000 mg | ORAL_TABLET | Freq: Every day | ORAL | Status: DC
  Administered 2023-07-09: 20 mg via ORAL
  Filled 2023-07-08: qty 1

## 2023-07-08 MED ORDER — DEXMEDETOMIDINE HCL IN NACL 80 MCG/20ML IV SOLN
INTRAVENOUS | Status: DC | PRN
  Administered 2023-07-08 (×3): 8 ug via INTRAVENOUS

## 2023-07-08 MED ORDER — PROPOFOL 10 MG/ML IV BOLUS
INTRAVENOUS | Status: AC
  Filled 2023-07-08: qty 20

## 2023-07-08 MED ORDER — HYDROMORPHONE HCL 2 MG/ML IJ SOLN
INTRAMUSCULAR | Status: AC
  Filled 2023-07-08: qty 1

## 2023-07-08 MED ORDER — DEXAMETHASONE SODIUM PHOSPHATE 10 MG/ML IJ SOLN
INTRAMUSCULAR | Status: DC | PRN
  Administered 2023-07-08: 10 mg via INTRAVENOUS

## 2023-07-08 MED ORDER — KETOROLAC TROMETHAMINE 15 MG/ML IJ SOLN
15.0000 mg | Freq: Four times a day (QID) | INTRAMUSCULAR | Status: DC
  Administered 2023-07-08 – 2023-07-09 (×4): 15 mg via INTRAVENOUS
  Filled 2023-07-08 (×4): qty 1

## 2023-07-08 MED ORDER — ONDANSETRON HCL 4 MG/2ML IJ SOLN
4.0000 mg | Freq: Once | INTRAMUSCULAR | Status: DC | PRN
Start: 2023-07-08 — End: 2023-07-08

## 2023-07-08 MED ORDER — SODIUM CHLORIDE (PF) 0.9 % IJ SOLN
INTRAMUSCULAR | Status: AC
Start: 2023-07-08 — End: ?
  Filled 2023-07-08: qty 20

## 2023-07-08 MED ORDER — KETAMINE HCL 10 MG/ML IJ SOLN
INTRAMUSCULAR | Status: DC | PRN
  Administered 2023-07-08: 30 mg via INTRAVENOUS
  Administered 2023-07-08: 25 mg via INTRAVENOUS
  Administered 2023-07-08: 20 mg via INTRAVENOUS

## 2023-07-08 MED ORDER — MIDAZOLAM HCL 2 MG/2ML IJ SOLN
INTRAMUSCULAR | Status: DC | PRN
  Administered 2023-07-08: 2 mg via INTRAVENOUS

## 2023-07-08 MED ORDER — EPHEDRINE 5 MG/ML INJ
INTRAVENOUS | Status: AC
  Filled 2023-07-08: qty 5

## 2023-07-08 MED ORDER — DIPHENHYDRAMINE HCL 12.5 MG/5ML PO ELIX: 12.5000 mg | ORAL_SOLUTION | Freq: Four times a day (QID) | ORAL | Status: DC | PRN

## 2023-07-08 MED ORDER — LACTATED RINGERS IR SOLN
Status: DC | PRN
  Administered 2023-07-08: 1000 mL

## 2023-07-08 MED ORDER — KETAMINE HCL 50 MG/5ML IJ SOSY
PREFILLED_SYRINGE | INTRAMUSCULAR | Status: AC
  Filled 2023-07-08: qty 5

## 2023-07-08 MED ORDER — PROPOFOL 10 MG/ML IV BOLUS
INTRAVENOUS | Status: DC | PRN
Start: 2023-07-08 — End: 2023-07-08
  Administered 2023-07-08: 170 mg via INTRAVENOUS

## 2023-07-08 MED ORDER — TRIPLE ANTIBIOTIC 3.5-400-5000 EX OINT: 1.0000 | TOPICAL_OINTMENT | Freq: Three times a day (TID) | CUTANEOUS | Status: DC | PRN

## 2023-07-08 MED ORDER — TRAMADOL HCL 50 MG PO TABS
50.0000 mg | ORAL_TABLET | Freq: Four times a day (QID) | ORAL | Status: DC | PRN
  Administered 2023-07-08: 50 mg via ORAL
  Filled 2023-07-08: qty 1

## 2023-07-08 MED ORDER — BUPIVACAINE-EPINEPHRINE (PF) 0.5% -1:200000 IJ SOLN
INTRAMUSCULAR | Status: AC
  Filled 2023-07-08: qty 30

## 2023-07-08 MED ORDER — HYDROMORPHONE HCL 1 MG/ML IJ SOLN
INTRAMUSCULAR | Status: AC
  Filled 2023-07-08: qty 1

## 2023-07-08 MED ORDER — LACTATED RINGERS IV SOLN: INTRAVENOUS | Status: DC

## 2023-07-08 MED ORDER — ONDANSETRON HCL 4 MG/2ML IJ SOLN
INTRAMUSCULAR | Status: AC
  Filled 2023-07-08: qty 2

## 2023-07-08 MED ORDER — DIPHENHYDRAMINE HCL 50 MG/ML IJ SOLN: 12.5000 mg | Freq: Four times a day (QID) | INTRAMUSCULAR | Status: DC | PRN

## 2023-07-08 MED ORDER — ONDANSETRON HCL 4 MG/2ML IJ SOLN
INTRAMUSCULAR | Status: DC | PRN
  Administered 2023-07-08: 4 mg via INTRAVENOUS

## 2023-07-08 SURGICAL SUPPLY — 52 items
APPLICATOR COTTON TIP 6 STRL (MISCELLANEOUS) ×3 IMPLANT
APPLICATOR COTTON TIP 6IN STRL (MISCELLANEOUS) ×2 IMPLANT
BAG COUNTER SPONGE SURGICOUNT (BAG) IMPLANT
CATH FOLEY 2WAY SLVR 18FR 30CC (CATHETERS) ×3 IMPLANT
CATH TIEMANN FOLEY 18FR 5CC (CATHETERS) ×3 IMPLANT
CHLORAPREP W/TINT 26 (MISCELLANEOUS) ×3 IMPLANT
CLIP LIGATING HEM O LOK PURPLE (MISCELLANEOUS) ×3 IMPLANT
COVER SURGICAL LIGHT HANDLE (MISCELLANEOUS) ×3 IMPLANT
COVER TIP SHEARS 8 DVNC (MISCELLANEOUS) ×3 IMPLANT
CUTTER ECHEON FLEX ENDO 45 340 (ENDOMECHANICALS) ×3 IMPLANT
DERMABOND ADVANCED .7 DNX12 (GAUZE/BANDAGES/DRESSINGS) ×3 IMPLANT
DRAPE ARM DVNC X/XI (DISPOSABLE) ×12 IMPLANT
DRAPE COLUMN DVNC XI (DISPOSABLE) ×3 IMPLANT
DRAPE SURG IRRIG POUCH 19X23 (DRAPES) ×3 IMPLANT
DRIVER NDL LRG 8 DVNC XI (INSTRUMENTS) ×6 IMPLANT
DRIVER NDLE LRG 8 DVNC XI (INSTRUMENTS) ×4 IMPLANT
DRSG TEGADERM 4X4.75 (GAUZE/BANDAGES/DRESSINGS) ×3 IMPLANT
ELECT PENCIL ROCKER SW 15FT (MISCELLANEOUS) ×3 IMPLANT
ELECT REM PT RETURN 15FT ADLT (MISCELLANEOUS) ×3 IMPLANT
FORCEPS BPLR LNG DVNC XI (INSTRUMENTS) ×3 IMPLANT
FORCEPS PROGRASP DVNC XI (FORCEP) ×3 IMPLANT
GAUZE 4X4 16PLY ~~LOC~~+RFID DBL (SPONGE) IMPLANT
GAUZE SPONGE 2X2 8PLY STRL LF (GAUZE/BANDAGES/DRESSINGS) IMPLANT
GAUZE SPONGE 4X4 12PLY STRL (GAUZE/BANDAGES/DRESSINGS) ×3 IMPLANT
GLOVE BIO SURGEON STRL SZ 6.5 (GLOVE) ×3 IMPLANT
GLOVE SURG LX STRL 7.5 STRW (GLOVE) ×6 IMPLANT
GOWN STRL REUS W/ TWL XL LVL3 (GOWN DISPOSABLE) ×6 IMPLANT
GOWN STRL SURGICAL XL XLNG (GOWN DISPOSABLE) ×3 IMPLANT
HOLDER FOLEY CATH W/STRAP (MISCELLANEOUS) ×3 IMPLANT
IRRIGATION SUCT STRKRFLW 2 WTP (MISCELLANEOUS) ×3 IMPLANT
IV LACTATED RINGERS 1000ML (IV SOLUTION) ×3 IMPLANT
KIT TURNOVER KIT A (KITS) IMPLANT
PACK ROBOT UROLOGY CUSTOM (CUSTOM PROCEDURE TRAY) ×3 IMPLANT
PAD POSITIONING PINK XL (MISCELLANEOUS) ×3 IMPLANT
PLUG CATH AND CAP STRL 200 (CATHETERS) IMPLANT
RELOAD STAPLE 45 4.1 GRN THCK (STAPLE) ×3 IMPLANT
SCISSORS MNPLR CVD DVNC XI (INSTRUMENTS) ×3 IMPLANT
SEAL UNIV 5-12 XI (MISCELLANEOUS) ×12 IMPLANT
SET TUBE SMOKE EVAC HIGH FLOW (TUBING) ×3 IMPLANT
SOL PREP POV-IOD 4OZ 10% (MISCELLANEOUS) ×3 IMPLANT
SOLUTION ELECTROSURG ANTI STCK (MISCELLANEOUS) ×3 IMPLANT
SPIKE FLUID TRANSFER (MISCELLANEOUS) ×3 IMPLANT
SPONGE T-LAP 4X18 ~~LOC~~+RFID (SPONGE) IMPLANT
STAPLE RELOAD 45 GRN (STAPLE) ×2 IMPLANT
SUT ETHILON 3 0 PS 1 (SUTURE) ×3 IMPLANT
SUT MNCRL AB 4-0 PS2 18 (SUTURE) ×6 IMPLANT
SUT VIC AB 0 CT1 27XBRD ANTBC (SUTURE) ×3 IMPLANT
SUT VICRYL 0 UR6 27IN ABS (SUTURE) ×6 IMPLANT
SUTURE STRAT PDS 2-0 15 CT-2.5 (SUTURE) ×3 IMPLANT
SUTURE V-LC BRB 180 2/0GR6GS22 (SUTURE) ×3 IMPLANT
SUTURE VLOC BRB 180 ABS3/0GR12 (SUTURE) ×6 IMPLANT
WATER STERILE IRR 1000ML POUR (IV SOLUTION) ×3 IMPLANT

## 2023-07-08 NOTE — Op Note (Signed)
 Preoperative diagnosis:  Prostate Cancer   Postoperative diagnosis:  same   Procedure: Robotic assisted laparoscopic radical prostatectomy Bilateral pelvic lymph node dissection  Surgeon: Andrez Banker, MD First Assistant: Dereck Flavin, PA Resident assistant: Alphonza Ashing, MD  An assistant was required for this surgical procedure.  The duties of the assistant included but were not limited to suctioning, passing suture, camera manipulation, retraction. This procedure would not be able to be performed without an Geophysicist/field seismologist.   Anesthesia: General  Complications: None  Intraoperative findings:  Bilateral nerve sparing Tight vesico-urethral anastomosis Adherent rectal plan.  EBL: 150cc  Specimens:  #1.  Prostate and seminal vesicals #2.  Bilateral pelvic lymph nodes  Indication: Craig Murphy is a 48 y.o. patient with prostate cancer.  After reviewing the management options for treatment, he elected to proceed with the removal of his prostate. We have discussed the potential benefits and risks of the procedure, side effects of the proposed treatment, the likelihood of the patient achieving the goals of the procedure, and any potential problems that might occur during the procedure or recuperation. Informed consent has been obtained.  Description of procedure:  The patient was consented in the preoperative holding area. He was in brought back to the operating room placed the table in supine position. General anesthesia was then induced and endotracheal tube was inserted. He was then placed in dorsolithotomy position and placed in steep Trendelenburg. He was then prepped and draped in the routine sterile fashion. We, the first assistant and I, then began by making a 10 mm incision supraumbilical midline incision the skin down through into the peritoneum. Then placed a 8 mm trocar. I then inflated the abdomen and inserted the 0 robotic lens. We then placed 2 additional a 8  millimeter trochars in the patient's left lower abdomen proximally 9 cm apart and 2 trochars on the patient's right lower abdomen, one was an 8 mm trocar and the one most lateral was a 12 mm trocar which was used as the assistant port. A 5 mm trocar was placed by triangulating the 2 right lateral ports as a second assistant port. These ports were all placed under visual guidance. Once the ports were noted to be satisfactory position the robot was docked. We started with the 0 lens, monopolar scissors in the right hand and the [place]  forceps the left hand as well as a fenestrated grasper as the third arm on the left-hand side.   We, the first assistant and I,  began our dissection the posterior plane incising the peritoneum at the level of the vas deferens. Isolated the left vas deferens and dissected it proximally towards the spermatic cord for 5 cm prior to ligating it. Then used this as traction to isolate the left the seminal vesicle which was then undressed bluntly and completely dissected out, all vessels were cauterized with a combination of bipolar and the monopolar scissors. We then turned our attention to the right side and similarly dissected out the right vas deferens and seminal vesicle. Once the SVs had been freed, we turned our attention to the posterior plane and bluntly dissected the tissue between the rectum and the posterior wall of the prostate bluntly out towards the apex.    At this point the bladder was taken down starting at the urachal remnant with a combination of both blunt dissection and sharp dissection using monopolar cautery the bladder was dropped down in the usual fashion to the medial umbilical ligaments laterally and the dorsal vein  of the prostate anteriorly creating our space of Retzius. We then turned our attention to the endopelvic fascia which was incised laterally starting on the patient's right-hand side the levator muscles were pushed off the prostate laterally up  towards the dorsal vein complex on the right-hand side. This process was then repeated on the left-hand side and a nice notch was created for the dorsal vein. I then used a 45mm stapler to staple the dorsal vein.   We,the first assistant and I, then located the bladder neck at the vesicoprostatic junction and using the monopolar scissors dissected down through the perivesical tissues and the bladder neck down to the prostatic urethra. The catheter was then deflated and pulled through our urethral opening and then used to retract the prostate anteriorly for the posterior bladder neck dissection. Once through the bladder neck and into the posterior plane of the prostate, the SVs were brought through the opening. The left pedicle was then isolated and systematically ligated with Weck clips and scissors. The nerve bundle was then peeled off the posterior lateral aspect of the prostate and bluntly dissected away off the prostate. This was then repeated on the right side.    I then came down through the dorsal venous complex anteriorly down to the membranous urethra using the monopolar. Once down to the urethra, it was transected sharply and the apex of the prostate was then dissected off the levator and rectourethralis muscles. Once the apex of the prostate had been dissected free we came back to the base of the prostate and bluntly push the rectum and nerve vascular bundle off the prostate the patient's left and used clips on the patient's right to free the prostate. Once the prostate was free it was placed off to the side. The pelvis was then irrigated with normal saline and noted to be relatively hemostatic.  Attention was then turned to the right pelvic sidewall. The fibrofatty tissue between the external iliac vein, confluence of the iliac vessels, hypogastric artery, and Cooper's ligament was dissected free from the pelvic sidewall with care to preserve the obturator nerve. Weck clips were used for  lymphostasis and hemostasis. An identical procedure was performed on the contralateral side and the lymphatic packets were removed for permanent pathologic analysis.  The prostate and both lymph node tissues were placed in the Endo Catch bag and the string brought to the 5 mm port.    The vesicourethral anastomosis was then completed with 2 interlocking 3-0 V. lock sutures running the anastomosis in the 6:00 position to the 12:00 position on each side and then tying it off on the top. The final catheter was then passed through the patient's urethra and into the bladder and 120 cc was instilled into the bladder to test the anastomosis. As there was no leak a 48 [place] Blake drain was passed through the left lateral port and placed around the vesicourethral anastomosis. A 12 mm assistant port on the right lateral side was then closed with 0 Vicryl with the help of the [employer] needle. The 12 mm midline infraumbilical incision was then extended another centimeter taken down and the fascia opened to remove the Endo Catch bag with the prostate specimen. The fascia was then closed with a 0 Vicryl and all skin ports were closed with 4-0 Monocryl in a subcutaneous fashion. Dermabond glue was then applied to the incisions. The drain was then secured to the skin with a 0 nylon stitch and dressing applied.   At the  end of the case all laps needles and sponges had been accounted for. There no immediate complications. The patient returned to the PACU in stable condition.

## 2023-07-08 NOTE — Transfer of Care (Signed)
 Immediate Anesthesia Transfer of Care Note  Patient: Craig Murphy  Procedure(s) Performed: PROSTATECTOMY, RADICAL, ROBOT-ASSISTED, LAPAROSCOPIC LYMPHADENECTOMY, PELVIS, ROBOT-ASSISTED (Bilateral)  Patient Location: PACU  Anesthesia Type:General  Level of Consciousness: drowsy and patient cooperative  Airway & Oxygen Therapy: Patient Spontanous Breathing  Post-op Assessment: Report given to RN and Post -op Vital signs reviewed and stable  Post vital signs: Reviewed and stable  Last Vitals:  Vitals Value Taken Time  BP 158/106 07/08/23 1134  Temp    Pulse 90 07/08/23 1135  Resp 11 07/08/23 1135  SpO2 98 % 07/08/23 1135  Vitals shown include unfiled device data.  Last Pain:  Vitals:   07/08/23 0551  TempSrc: Oral  PainSc: 0-No pain      Patients Stated Pain Goal: 5 (07/08/23 0551)  Complications: No notable events documented.

## 2023-07-08 NOTE — H&P (Signed)
 C/HPI: cc: Prostate cancer    Dx: Gleason 3+3 = 6 on 02/28/2022    PSA: 6.6    Prostate biopsy, 02/28/2022: Gleason 3+3 = 6-right lateral base/medial base, right lateral mid and right medial apex  Biopsy: 9/24: 3+4 = 7 in 2 cores (right side) and Gleason 3+3 = 6 in 2 cores.    MRI 9/24: No evidence of high-grade cancer. Prostate volume 22 g, patient with a large median lobe    History of prostatitis with improvement in his voiding symptoms when treated with tamsulosin, meloxicam, and physical therapy. Also has some mild erectile dysfunction, but seems to be improving.      ALLERGIES: No Allergies     MEDICATIONS: Amlodipine Besylate 5 mg tablet  Aspirin 81 MG TABS Oral  Gabapentin  100 mg capsule  Multivitamin      GU PSH: Prostate Needle Biopsy - 10/27/2022, 03/31/2022        PSH Notes: Encounter for contraceptive planning, Open Lung Biopsy    NON-GU PSH: Surgical Pathology, Gross And Microscopic Examination For Prostate Needle - 10/27/2022, 03/31/2022       GU PMH: Prostate Cancer - 10/27/2022, - 04/09/2022 Elevated PSA - 03/31/2022, - 03/03/2022, - 09/01/2021, - 07/21/2021, - 2022, - 2021, - 2021 Chronic prostatitis - 09/01/2021 Acute prostatitis - 08/25/2021, - 2022, - 2022, - 2021, - 2021      PMH Notes:  1898-02-09 00:00:00 - Note: Normal Routine History And Physical Adult    NON-GU PMH: Muscle weakness (generalized) - 09/01/2021, - 08/25/2021, - 2022, - 2022 Other muscle spasm - 08/25/2021, - 2022, - 2022 Other specified disorders of muscle - 08/25/2021, - 2022, - 2022     FAMILY HISTORY: Acute Myocardial Infarction - Father Death In The Family Father - Father Diabetes - Runs in Family Family Health Status Number - Runs In Family    SOCIAL HISTORY: Marital Status: Married Preferred Language: English Current Smoking Status: Patient has never smoked.    Tobacco Use Assessment Completed: Used Tobacco in last 30 days? Drinks 4 drinks per day.  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     Notes: Caffeine Use, Alcohol Use, Marital History - Currently Married, Occupation:, Tobacco Use    REVIEW OF SYSTEMS:    GU Review Male:   Patient denies frequent urination, hard to postpone urination, burning/ pain with urination, get up at night to urinate, leakage of urine, stream starts and stops, trouble starting your stream, have to strain to urinate , erection problems, and penile pain.  Gastrointestinal (Upper):   Patient denies nausea, vomiting, and indigestion/ heartburn.  Gastrointestinal (Lower):   Patient denies diarrhea and constipation.  Constitutional:   Patient denies fever, night sweats, weight loss, and fatigue.  Skin:   Patient denies itching and skin rash/ lesion.  Eyes:   Patient denies blurred vision and double vision.  Ears/ Nose/ Throat:   Patient denies sore throat and sinus problems.  Hematologic/Lymphatic:   Patient denies swollen glands and easy bruising.  Cardiovascular:   Patient denies leg swelling and chest pains.  Respiratory:   Patient denies cough and shortness of breath.  Endocrine:   Patient denies excessive thirst.  Musculoskeletal:   Patient denies back pain and joint pain.  Neurological:   Patient denies headaches and dizziness.  Psychologic:   Patient denies depression and anxiety.    VITAL SIGNS: None    MULTI-SYSTEM PHYSICAL EXAMINATION:    Constitutional: Well-nourished. No physical deformities. Normally developed. Good grooming.  Respiratory: Normal breath  sounds. No labored breathing, no use of accessory muscles.   Cardiovascular: Regular rate and rhythm. No murmur, no gallop. Normal temperature, normal extremity pulses, no swelling, no varicosities.       Complexity of Data:  Source Of History:  Patient  Lab Test Review:   PSA  Records Review:   Pathology Reports, Previous Doctor Records, Previous Patient Records  Urine Test Review:   Urinalysis  Urodynamics Review:   Review Bladder Scan   10/09/22 02/24/22 08/25/21  07/21/21 09/25/19  PSA  Total PSA 5.68 ng/mL 6.59 ng/mL 4.69 ng/mL 5.44 ng/mL 5.86 ng/mL  Free PSA     0.45 ng/mL  % Free PSA     8 % PSA      PROCEDURES:           Visit Complexity - G2211     ASSESSMENT:      ICD-10 Details  1 GU:   Prostate Cancer - C61     PLAN:            Schedule Return Visit/Planned Activity: ASAP - PT Referral             Note: Preop RALP            Document Letter(s):  Created for Patient: Clinical Summary           Notes:   We discussed prostatectomy and specifically robotic prostatectomy with bilateral pelvic lymphadenectomy being the technique that I most commonly perform. I showed the patient on their abdomen the approximately 6 small incision (trocar) sites as well as presumed extraction sites with robotic approach as well as possible open incision sites should open conversion be necessary. We discussed peri-operative risks including bleeding, infection, deep vein thrombosis, pulmonary embolism, compartment syndrome, nuropathy / neuropraxia, heart attack, stroke, death, as well as long-term risks such as non-cure / need for additional therapy. We specifically addressed that the procedure would compromise urinary control leading to stress incontinence which typically resolves with time and pelvic rehabilitation (Kegel's, etc..), but can sometimes be permanent and require additional therapy including surgery. We also specifically addressed sexual sequellae including significant erectile dysfunction which typically partially resolves with time but can also be permanent and require additional therapy including surgery.    We discussed the typical hospital course including usual 1-2 night hospitalization, discharge with foley catheter in place usually for 1-2 weeks before voiding trial as well as usually 2 week recovery until able to perform most non-strenuous activity and 6 weeks until able to return to most jobs and more strenuous activity such as exercise.     Will plan to perform bilateral pelvic node dissection as well as bilateral nerve sparing prostatectomy.    Will get this scheduled for him quickly, would like him to see the pelvic floor physical therapist preoperatively.

## 2023-07-08 NOTE — Discharge Instructions (Signed)

## 2023-07-08 NOTE — Interval H&P Note (Signed)
 History and Physical Interval Note:  07/08/2023 7:30 AM  [name]  has presented today for surgery, with the diagnosis of PROSTATE CANCER.  The various methods of treatment have been discussed with the patient and family. After consideration of risks, benefits and other options for treatment, the patient has consented to  Procedure(s): PROSTATECTOMY, RADICAL, ROBOT-ASSISTED, LAPAROSCOPIC (N/A) LYMPHADENECTOMY, PELVIS, ROBOT-ASSISTED (Bilateral) as a surgical intervention.  The patient's history has been reviewed, patient examined, no change in status, stable for surgery.  I have reviewed the patient's chart and labs.  Questions were answered to the patient's satisfaction.     Craig Murphy

## 2023-07-08 NOTE — Anesthesia Postprocedure Evaluation (Signed)
 Anesthesia Post Note  Patient: Craig Murphy  Procedure(s) Performed: PROSTATECTOMY, RADICAL, ROBOT-ASSISTED, LAPAROSCOPIC LYMPHADENECTOMY, PELVIS, ROBOT-ASSISTED (Bilateral)     Patient location during evaluation: PACU Anesthesia Type: General Level of consciousness: awake and alert Pain management: pain level controlled Vital Signs Assessment: post-procedure vital signs reviewed and stable Respiratory status: spontaneous breathing, nonlabored ventilation, respiratory function stable and patient connected to nasal cannula oxygen Cardiovascular status: blood pressure returned to baseline and stable Postop Assessment: no apparent nausea or vomiting Anesthetic complications: no  No notable events documented.  Last Vitals:  Vitals:   07/08/23 1200 07/08/23 1215  BP: (!) 157/106 (!) 146/97  Pulse: 85 86  Resp: 12 12  Temp:    SpO2: 98% 91%    Last Pain:  Vitals:   07/08/23 1215  TempSrc:   PainSc: Asleep                 Rosalita Combe

## 2023-07-08 NOTE — Anesthesia Procedure Notes (Signed)
 Procedure Name: Intubation Date/Time: 07/08/2023 7:50 AM  Performed by: Delona Ferron, CRNAPre-anesthesia Checklist: Patient identified, Emergency Drugs available, Suction available and Patient being monitored Patient Re-evaluated:Patient Re-evaluated prior to induction Oxygen Delivery Method: Circle System Utilized Preoxygenation: Pre-oxygenation with 100% oxygen Induction Type: IV induction Ventilation: Mask ventilation without difficulty and Oral airway inserted - appropriate to patient size Laryngoscope Size: Glidescope and 4 Grade View: Grade I Tube type: Oral Number of attempts: 1 Airway Equipment and Method: Stylet and Oral airway Placement Confirmation: ETT inserted through vocal cords under direct vision, positive ETCO2 and breath sounds checked- equal and bilateral Secured at: 23 cm Tube secured with: Tape Dental Injury: Teeth and Oropharynx as per pre-operative assessment

## 2023-07-09 ENCOUNTER — Encounter (HOSPITAL_COMMUNITY): Payer: Self-pay | Admitting: Urology

## 2023-07-09 DIAGNOSIS — Z79899 Other long term (current) drug therapy: Secondary | ICD-10-CM | POA: Diagnosis not present

## 2023-07-09 DIAGNOSIS — C61 Malignant neoplasm of prostate: Secondary | ICD-10-CM | POA: Diagnosis not present

## 2023-07-09 DIAGNOSIS — Z7982 Long term (current) use of aspirin: Secondary | ICD-10-CM | POA: Diagnosis not present

## 2023-07-09 DIAGNOSIS — I10 Essential (primary) hypertension: Secondary | ICD-10-CM | POA: Diagnosis not present

## 2023-07-09 LAB — BASIC METABOLIC PANEL WITH GFR
Anion gap: 7 (ref 5–15)
BUN: 16 mg/dL (ref 6–20)
CO2: 23 mmol/L (ref 22–32)
Calcium: 8.8 mg/dL — ABNORMAL LOW (ref 8.9–10.3)
Chloride: 104 mmol/L (ref 98–111)
Creatinine, Ser: 1.37 mg/dL — ABNORMAL HIGH (ref 0.61–1.24)
GFR, Estimated: >60 mL/min (ref >60)
Glucose, Bld: 103 mg/dL — ABNORMAL HIGH (ref 70–99)
Potassium: 4.5 mmol/L (ref 3.5–5.1)
Sodium: 134 mmol/L — ABNORMAL LOW (ref 135–145)

## 2023-07-09 LAB — HEMOGLOBIN AND HEMATOCRIT, BLOOD
HCT: 38.3 % — ABNORMAL LOW (ref 39.0–52.0)
Hemoglobin: 12.3 g/dL — ABNORMAL LOW (ref 13.0–17.0)

## 2023-07-09 LAB — CREATININE, FLUID (PLEURAL, PERITONEAL, JP DRAINAGE): Creat, Fluid: 1.2 mg/dL

## 2023-07-09 MED ORDER — TRAMADOL HCL 50 MG PO TABS: 50.0000 mg | ORAL_TABLET | Freq: Four times a day (QID) | ORAL | Status: DC | PRN

## 2023-07-09 NOTE — Progress Notes (Signed)
 Mobility Specialist - Progress Note   07/09/23 0855  Mobility  Activity Ambulated independently in hallway  Level of Assistance Independent after set-up  Assistive Device Other (Comment) (IV Pole)  Distance Ambulated (ft) 700 ft  Range of Motion/Exercises Active  Activity Response Tolerated well  Mobility Referral Yes  Mobility visit 1 Mobility  Mobility Specialist Start Time (ACUTE ONLY) 0830  Mobility Specialist Stop Time (ACUTE ONLY) 0855  Mobility Specialist Time Calculation (min) (ACUTE ONLY) 25 min   Pt was found in bed and agreeable to ambulate. No complaints with session and returned to room with all needs met. RN in room.  Lorna Rose,  Mobility Specialist Can be reached via Secure Chat

## 2023-07-09 NOTE — Plan of Care (Signed)
  Problem: Pain Management: Goal: General experience of comfort will improve Outcome: Progressing   Problem: Skin Integrity: Goal: Demonstration of wound healing without infection will improve Outcome: Progressing   Problem: Urinary Elimination: Goal: Ability to achieve and maintain urine output will improve Outcome: Progressing   Problem: Activity: Goal: Risk for activity intolerance will decrease Outcome: Progressing   Problem: Pain Managment: Goal: General experience of comfort will improve and/or be controlled Outcome: Progressing

## 2023-07-09 NOTE — Discharge Summary (Signed)
 Date of admission: 07/08/2023  Date of discharge: 07/09/2023  Admission diagnosis: Prostate Cancer  Discharge diagnosis: Prostate Cancer  History and Physical: For full details, please see admission history and physical. Briefly, Craig Murphy is a 48 y.o. gentleman with localized prostate cancer.  After discussing management/treatment options, he elected to proceed with surgical treatment.  Hospital Course: [name] was taken to the operating room on 07/08/2023 and underwent a robotic assisted laparoscopic radical prostatectomy. He tolerated this procedure well and without complications. Postoperatively, he was able to be transferred to a regular hospital room following recovery from anesthesia.  He was able to begin ambulating the night of surgery. He remained hemodynamically stable overnight.  He had excellent urine output with appropriately minimal output from his pelvic drain and his pelvic drain was removed on POD #1.  He was transitioned to oral pain medication, tolerated a clear liquid diet, and had met all discharge criteria and was able to be discharged home later on POD#1.  Laboratory values:  Recent Labs    07/08/23 1145 07/09/23 0409  HGB 13.0 12.3*  HCT 39.9 38.3*    Disposition: Home  Discharge instruction: He was instructed to be ambulatory but to refrain from heavy lifting, strenuous activity, or driving. He was instructed on urethral catheter care.  Discharge medications:   Allergies as of 07/09/2023       Reactions   Gabapentin  Other (See Comments)   Upset stomach * Needs to be taken with food   Mobic [meloxicam]    Upset stomach * Needs to be taken with food        Medication List     STOP taking these medications    multivitamin tablet       TAKE these medications    acetaminophen 500 MG tablet Commonly known as: TYLENOL Take 1,000 mg by mouth every 6 (six) hours as needed (pain).   docusate sodium 100 MG capsule Commonly known as:  COLACE Take 1 capsule (100 mg total) by mouth 2 (two) times daily.   lisinopril 20 MG tablet Commonly known as: ZESTRIL Take 20 mg by mouth in the morning.   sulfamethoxazole-trimethoprim 800-160 MG tablet Commonly known as: BACTRIM DS Take 1 tablet by mouth 2 (two) times daily. Start the day prior to foley removal appointment   traMADol 50 MG tablet Commonly known as: Ultram Take 1-2 tablets (50-100 mg total) by mouth every 6 (six) hours as needed for moderate pain (pain score 4-6) or severe pain (pain score 7-10).        Followup: He will followup in 1 week for catheter removal and to discuss his surgical pathology results.

## 2023-07-09 NOTE — Discharge Summary (Signed)
 Date of admission: 07/08/2023  Date of discharge: 07/09/2023  Admission diagnosis: prostate cancer  Discharge diagnosis: same  Secondary diagnoses:  Patient Active Problem List   Diagnosis Date Noted   Prostate cancer (HCC) 07/08/2023   Cough 10/31/2018   Sarcoidosis 07/01/2016    Procedures performed: Procedure(s): PROSTATECTOMY, RADICAL, ROBOT-ASSISTED, LAPAROSCOPIC LYMPHADENECTOMY, PELVIS, ROBOT-ASSISTED  History and Physical: For full details, please see admission history and physical. Briefly, Craig Murphy is a 48 y.o. year old patient with prostate cancer.   Hospital Course: Patient tolerated the procedure well.  He was then transferred to the floor after an uneventful PACU stay.  His hospital course was uncomplicated.  On POD#1 he had met discharge criteria: was eating a regular diet, was up and ambulating independently,  pain was well controlled, and was ready to for discharge.  He was discharged with his foley catheter.  PE on day of discharge: NAD Vitals:   07/08/23 1731 07/08/23 2153 07/09/23 0143 07/09/23 0612  BP: (!) 146/93 139/87 (!) 132/93 126/88  Pulse: 81 75 70 68  Resp: 19 18 18 18   Temp: 98.9 F (37.2 C) 98.8 F (37.1 C) 98.2 F (36.8 C) 97.7 F (36.5 C)  TempSrc: Oral Oral Oral Oral  SpO2: 99% 100% 99% 100%  Weight:      Height:        Intake/Output Summary (Last 24 hours) at 07/09/2023 1022 Last data filed at 07/09/2023 0731 Gross per 24 hour  Intake 2004.19 ml  Output 3160 ml  Net -1155.81 ml   Non-labored breathing Abdomen slightly distended, incisions c/d/I Foley draining straw colored urine JP drain out Ext symmetric  Laboratory values:  Recent Labs    07/08/23 1145 07/09/23 0409  HGB 13.0 12.3*  HCT 39.9 38.3*   Recent Labs    07/09/23 0409  NA 134*  K 4.5  CL 104  CO2 23  GLUCOSE 103*  BUN 16  CREATININE 1.37*  CALCIUM 8.8*   No results for input(s): "LABPT", "INR" in the last 72 hours. No results for  input(s): "LABURIN" in the last 72 hours. No results found for this or any previous visit.  Disposition: Home  Discharge instruction: The patient was instructed to be ambulatory but told to refrain from heavy lifting, strenuous activity, or driving.   Discharge medications:  Allergies as of 07/09/2023       Reactions   Gabapentin  Other (See Comments)   Upset stomach * Needs to be taken with food   Mobic [meloxicam]    Upset stomach * Needs to be taken with food        Medication List     STOP taking these medications    multivitamin tablet       TAKE these medications    acetaminophen 500 MG tablet Commonly known as: TYLENOL Take 1,000 mg by mouth every 6 (six) hours as needed (pain).   docusate sodium 100 MG capsule Commonly known as: COLACE Take 1 capsule (100 mg total) by mouth 2 (two) times daily.   lisinopril 20 MG tablet Commonly known as: ZESTRIL Take 20 mg by mouth in the morning.   sulfamethoxazole-trimethoprim 800-160 MG tablet Commonly known as: BACTRIM DS Take 1 tablet by mouth 2 (two) times daily. Start the day prior to foley removal appointment   traMADol 50 MG tablet Commonly known as: Ultram Take 1-2 tablets (50-100 mg total) by mouth every 6 (six) hours as needed for moderate pain (pain score 4-6) or severe pain (pain score 7-10).  Followup:   Follow-up Information     Aquilla Knapp, NP Follow up on 07/15/2023.   Specialty: Nurse Practitioner Why: at 8:30 Contact information: [address] [place] [address] [place] [address] [phone]

## 2023-07-16 LAB — SURGICAL PATHOLOGY

## 2023-08-05 DIAGNOSIS — N393 Stress incontinence (female) (male): Secondary | ICD-10-CM | POA: Diagnosis not present

## 2023-08-05 DIAGNOSIS — M6281 Muscle weakness (generalized): Secondary | ICD-10-CM | POA: Diagnosis not present

## 2023-08-05 DIAGNOSIS — M62838 Other muscle spasm: Secondary | ICD-10-CM | POA: Diagnosis not present

## 2023-08-05 DIAGNOSIS — R351 Nocturia: Secondary | ICD-10-CM | POA: Diagnosis not present

## 2023-08-19 DIAGNOSIS — M6281 Muscle weakness (generalized): Secondary | ICD-10-CM | POA: Diagnosis not present

## 2023-08-19 DIAGNOSIS — N393 Stress incontinence (female) (male): Secondary | ICD-10-CM | POA: Diagnosis not present

## 2023-08-19 DIAGNOSIS — R351 Nocturia: Secondary | ICD-10-CM | POA: Diagnosis not present

## 2023-08-19 DIAGNOSIS — C61 Malignant neoplasm of prostate: Secondary | ICD-10-CM | POA: Diagnosis not present

## 2023-09-15 DIAGNOSIS — N393 Stress incontinence (female) (male): Secondary | ICD-10-CM | POA: Diagnosis not present

## 2023-09-15 DIAGNOSIS — M62838 Other muscle spasm: Secondary | ICD-10-CM | POA: Diagnosis not present

## 2023-09-15 DIAGNOSIS — M6281 Muscle weakness (generalized): Secondary | ICD-10-CM | POA: Diagnosis not present

## 2023-10-29 DIAGNOSIS — M62838 Other muscle spasm: Secondary | ICD-10-CM | POA: Diagnosis not present

## 2023-10-29 DIAGNOSIS — M6281 Muscle weakness (generalized): Secondary | ICD-10-CM | POA: Diagnosis not present

## 2023-10-29 DIAGNOSIS — N393 Stress incontinence (female) (male): Secondary | ICD-10-CM | POA: Diagnosis not present

## 2023-12-31 DIAGNOSIS — C61 Malignant neoplasm of prostate: Secondary | ICD-10-CM | POA: Diagnosis not present

## 2024-01-14 DIAGNOSIS — N393 Stress incontinence (female) (male): Secondary | ICD-10-CM | POA: Diagnosis not present
